# Patient Record
Sex: Male | Born: 2000
Health system: Southern US, Community
[De-identification: ages and names within clinical notes are randomized; demographics above are authoritative.]

## PROBLEM LIST (undated history)

## (undated) DIAGNOSIS — F909 Attention-deficit hyperactivity disorder, unspecified type: Secondary | ICD-10-CM

## (undated) HISTORY — DX: Attention-deficit hyperactivity disorder, unspecified type: F90.9

---
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
## 2018-02-07 ENCOUNTER — Encounter: Payer: Self-pay | Admitting: Sports Medicine

## 2018-02-07 ENCOUNTER — Ambulatory Visit: Payer: BLUE CROSS/BLUE SHIELD | Admitting: Sports Medicine

## 2018-02-07 ENCOUNTER — Ambulatory Visit (INDEPENDENT_AMBULATORY_CARE_PROVIDER_SITE_OTHER): Payer: BLUE CROSS/BLUE SHIELD

## 2018-02-07 ENCOUNTER — Ambulatory Visit: Payer: No Typology Code available for payment source | Admitting: Family Medicine

## 2018-02-07 DIAGNOSIS — M51369 Other intervertebral disc degeneration, lumbar region without mention of lumbar back pain or lower extremity pain: Secondary | ICD-10-CM

## 2018-02-07 DIAGNOSIS — M5136 Other intervertebral disc degeneration, lumbar region: Secondary | ICD-10-CM

## 2018-02-07 DIAGNOSIS — M5117 Intervertebral disc disorders with radiculopathy, lumbosacral region: Secondary | ICD-10-CM | POA: Diagnosis not present

## 2018-02-07 DIAGNOSIS — M5116 Intervertebral disc disorders with radiculopathy, lumbar region: Secondary | ICD-10-CM | POA: Diagnosis not present

## 2018-02-07 MED ORDER — GADOBENATE DIMEGLUMINE 529 MG/ML IV SOLN
20.0000 mL | Freq: Once | INTRAVENOUS | Status: AC | PRN
  Administered 2018-02-07: 20 mL via INTRAVENOUS

## 2018-02-07 NOTE — Progress Notes (Addendum)
Subjective:    CC: Back pain, leg pain  HPI: David Richards is a pleasant 17 year old male, in December of last year he had a L5-S1 laminectomy with discectomy for left-sided S1 radiculitis, unfortunately he had no relief in his back pain or his leg pain.  He did have some L4-L5 spinal stenosis on a previous MRI.  Symptoms are moderate, persistent, no bowel or bladder dysfunction, saddle numbness, some constitutional symptoms, trauma.  He runs down the back of his left leg to the back of the thigh but stops at the knee.  Worse with standing.  I reviewed the past medical history, family history, social history, surgical history, and allergies today and no changes were needed.  Please see the problem list section below in epic for further details.  Past Medical History: Past Medical History:  Diagnosis Date  . ADHD (attention deficit hyperactivity disorder)   . Asthma    exercise induced   Past Surgical History: No past surgical history on file. Social History: Social History   Socioeconomic History  . Marital status: Single    Spouse name: Not on file  . Number of children: Not on file  . Years of education: Not on file  . Highest education level: Not on file  Occupational History  . Occupation: Consulting civil engineer  Social Needs  . Financial resource strain: Not on file  . Food insecurity:    Worry: Not on file    Inability: Not on file  . Transportation needs:    Medical: Not on file    Non-medical: Not on file  Tobacco Use  . Smoking status: Never Smoker  . Smokeless tobacco: Never Used  Substance and Sexual Activity  . Alcohol use: No  . Drug use: No  . Sexual activity: Not on file  Lifestyle  . Physical activity:    Days per week: Not on file    Minutes per session: Not on file  . Stress: Not on file  Relationships  . Social connections:    Talks on phone: Not on file    Gets together: Not on file    Attends religious service: Not on file    Active member of club or  organization: Not on file    Attends meetings of clubs or organizations: Not on file    Relationship status: Not on file  Other Topics Concern  . Not on file  Social History Narrative   Not the best diet.  Plays basket ball for fun. In school at [employer].  Does well in school    Family History: Family History  Problem Relation Age of Onset  . Hypertension Father   . Obesity Father    Allergies: Allergies  Allergen Reactions  . Amoxicillin-Pot Clavulanate Other (See Comments)    Abdominal pain and cramping.   . Ampicillin Diarrhea   Medications: See med rec.  Review of Systems: No fevers, chills, night sweats, weight loss, chest pain, or shortness of breath.   Objective:    General: Well Developed, well nourished, and in no acute distress.  Neuro: Alert and oriented x3, extra-ocular muscles intact, sensation grossly intact.  HEENT: Normocephalic, atraumatic, pupils equal round reactive to light, neck supple, no masses, no lymphadenopathy, thyroid nonpalpable.  Skin: Warm and dry, no rashes. Cardiac: Regular rate and rhythm, no murmurs rubs or gallops, no lower extremity edema.  Respiratory: Clear to auscultation bilaterally. Not using accessory muscles, speaking in full sentences. Back Exam:  Inspection: Unremarkable  Motion: Flexion 45 deg, Extension  45 deg, Side Bending to 45 deg bilaterally,  Rotation to 45 deg bilaterally  SLR laying: Negative  XSLR laying: Negative  Palpable tenderness: None. FABER: negative. Sensory change: Gross sensation intact to all lumbar and sacral dermatomes.  Reflexes: 2+ at both patellar tendons, 2+ at achilles tendons, Babinski's downgoing.  Strength at foot  Plantar-flexion: 5/5 Dorsi-flexion: 5/5 Eversion: 5/5 Inversion: 5/5  Leg strength  Quad: 5/5 Hamstring: 5/5 Hip flexor: 5/5 Hip abductors: 5/5  Gait unremarkable.  Impression and Recommendations:    Lumbar degenerative disc disease Post L5-S1 laminectomy with discectomy for  left S1 radiculopathy. Never had resolution of radicular symptoms after his discectomy. No nerve conduction study done. At this point we are going to proceed with a new MRI, he did have some L4-L5 central canal stenosis. We will probably proceed with gabapentin first and then epidurals if not significant we better, I do for see a nerve conduction/EMG study in his future.  Unfortunately there is a large amount of residual disc herniation at the L5-S1 level clearly impinging the nerve roots, ultimately I think we need to try a couple of epidurals and if insufficient relief get a second opinion from a different spine surgeon, Dr. Estill BambergMark Dumonski. ___________________________________________ David Richards J. Benjamin Stainhekkekandam, M.D., ABFM., CAQSM. Primary Care and Sports Medicine [facility]  Adjunct Instructor of Family Medicine  [employer]

## 2018-02-07 NOTE — Assessment & Plan Note (Addendum)
Post L5-S1 laminectomy with discectomy for left S1 radiculopathy. Never had resolution of radicular symptoms after his discectomy. No nerve conduction study done. At this point we are going to proceed with a new MRI, he did have some L4-L5 central canal stenosis. We will probably proceed with gabapentin first and then epidurals if not significant we better, I do for see a nerve conduction/EMG study in his future.  Unfortunately there is a large amount of residual disc herniation at the L5-S1 level clearly impinging the nerve roots, ultimately I think we need to try a couple of epidurals and if insufficient relief get a second opinion from a different spine surgeon, Dr. Estill BambergMark Dumonski.

## 2018-02-08 ENCOUNTER — Ambulatory Visit: Payer: BLUE CROSS/BLUE SHIELD | Admitting: Sports Medicine

## 2018-02-08 ENCOUNTER — Encounter: Payer: Self-pay | Admitting: Sports Medicine

## 2018-02-08 DIAGNOSIS — M5136 Other intervertebral disc degeneration, lumbar region: Secondary | ICD-10-CM | POA: Diagnosis not present

## 2018-02-08 DIAGNOSIS — M51369 Other intervertebral disc degeneration, lumbar region without mention of lumbar back pain or lower extremity pain: Secondary | ICD-10-CM

## 2018-02-08 MED ORDER — DIAZEPAM 5 MG PO TABS: ORAL_TABLET | ORAL | 0 refills | Status: DC

## 2018-02-08 MED ORDER — PREGABALIN 75 MG PO CAPS: 75.0000 mg | ORAL_CAPSULE | Freq: Two times a day (BID) | ORAL | 0 refills | Status: DC

## 2018-02-08 NOTE — Assessment & Plan Note (Signed)
Post L5-S1 laminectomy with [facility]. Never had resolution of radicular symptoms after discectomy. No nerve conduction studies. He did have some L4-L5 central canal stenosis and a previous MRI, new MRI with and without contrast does show either recurrent or residual disc extrusion at L5-S1 with clear contact of the intraspinal left S1 nerve root. Proceeding with a left S1 epidural with Valium for preprocedural anxiolysis. I would also like him to get a second opinion from Dr. Estill BambergMark Dumonski and Jason CoopKayla McKenzie, PA-C. Adding some Lyrica in the meantime for symptom medic relief.

## 2018-02-08 NOTE — Progress Notes (Signed)
Subjective:    CC: MRI results  HPI: This is a pleasant 17 year old male with lumbar degenerative disc disease, 2 level.  He is post L5-S1 laminectomy with microdiscectomy, unfortunately never had resolution of his radicular pain after the surgery.  He presents to me today for a second opinion, we obtained an MRI with and without contrast that showed a large L5-S1 disc extrusion with contact to the left S1 nerve root.  He also has a moderate-sized L4-L5 disc protrusion.  Persistent pain down the left leg without weakness, bowel or bladder dysfunction, saddle numbness or constitutional symptoms.  I reviewed the past medical history, family history, social history, surgical history, and allergies today and no changes were needed.  Please see the problem list section below in epic for further details.  Past Medical History: Past Medical History:  Diagnosis Date  . ADHD (attention deficit hyperactivity disorder)   . Asthma    exercise induced   Past Surgical History: No past surgical history on file. Social History: Social History   Socioeconomic History  . Marital status: Single    Spouse name: Not on file  . Number of children: Not on file  . Years of education: Not on file  . Highest education level: Not on file  Occupational History  . Occupation: Consulting civil engineer  Social Needs  . Financial resource strain: Not on file  . Food insecurity:    Worry: Not on file    Inability: Not on file  . Transportation needs:    Medical: Not on file    Non-medical: Not on file  Tobacco Use  . Smoking status: Never Smoker  . Smokeless tobacco: Never Used  Substance and Sexual Activity  . Alcohol use: No  . Drug use: No  . Sexual activity: Not on file  Lifestyle  . Physical activity:    Days per week: Not on file    Minutes per session: Not on file  . Stress: Not on file  Relationships  . Social connections:    Talks on phone: Not on file    Gets together: Not on file    Attends religious  service: Not on file    Active member of club or organization: Not on file    Attends meetings of clubs or organizations: Not on file    Relationship status: Not on file  Other Topics Concern  . Not on file  Social History Narrative   Not the best diet.  Plays basket ball for fun. In school at [employer].  Does well in school    Family History: Family History  Problem Relation Age of Onset  . Hypertension Father   . Obesity Father    Allergies: Allergies  Allergen Reactions  . Amoxicillin-Pot Clavulanate Other (See Comments)    Abdominal pain and cramping.   . Ampicillin Diarrhea   Medications: See med rec.  Review of Systems: No fevers, chills, night sweats, weight loss, chest pain, or shortness of breath.   Objective:    General: Well Developed, well nourished, and in no acute distress.  Neuro: Alert and oriented x3, extra-ocular muscles intact, sensation grossly intact.  HEENT: Normocephalic, atraumatic, pupils equal round reactive to light, neck supple, no masses, no lymphadenopathy, thyroid nonpalpable.  Skin: Warm and dry, no rashes. Cardiac: Regular rate and rhythm, no murmurs rubs or gallops, no lower extremity edema.  Respiratory: Clear to auscultation bilaterally. Not using accessory muscles, speaking in full sentences.  Impression and Recommendations:    Lumbar degenerative  disc disease Post L5-S1 laminectomy with [facility]. Never had resolution of radicular symptoms after discectomy. No nerve conduction studies. He did have some L4-L5 central canal stenosis and a previous MRI, new MRI with and without contrast does show either recurrent or residual disc extrusion at L5-S1 with clear contact of the intraspinal left S1 nerve root. Proceeding with a left S1 epidural with Valium for preprocedural anxiolysis. I would also like him to get a second opinion from Dr. Estill BambergMark Dumonski and Jason CoopKayla McKenzie, PA-C. Adding some Lyrica in  the meantime for symptom medic relief.  I spent 40 minutes with this patient, greater than 50% was face-to-face time counseling regarding the above diagnoses, this was spent discussing the natural history of degenerative disc disease. ___________________________________________ Ihor Austinhomas J. Benjamin Stainhekkekandam, M.D., ABFM., CAQSM. Primary Care and Sports Medicine [facility]  Adjunct Instructor of Family Medicine  [employer]

## 2018-02-11 ENCOUNTER — Ambulatory Visit: Payer: Self-pay | Admitting: Sports Medicine

## 2018-02-22 ENCOUNTER — Encounter: Payer: Self-pay | Admitting: Sports Medicine

## 2018-02-23 ENCOUNTER — Other Ambulatory Visit: Payer: BLUE CROSS/BLUE SHIELD

## 2018-02-23 MED ORDER — GABAPENTIN 300 MG PO CAPS: ORAL_CAPSULE | ORAL | 3 refills | Status: DC

## 2018-02-24 ENCOUNTER — Ambulatory Visit
Admission: RE | Admit: 2018-02-24 | Discharge: 2018-02-24 | Disposition: A | Discharge status: Home / Self Care | Payer: BLUE CROSS/BLUE SHIELD | Source: Ambulatory Visit | Attending: Sports Medicine | Admitting: Sports Medicine

## 2018-02-24 MED ORDER — METHYLPREDNISOLONE ACETATE 40 MG/ML INJ SUSP (RADIOLOG
120.0000 mg | Freq: Once | INTRAMUSCULAR | Status: AC
  Administered 2018-02-24: 120 mg via EPIDURAL

## 2018-02-24 MED ORDER — IOPAMIDOL (ISOVUE-M 200) INJECTION 41%
1.0000 mL | Freq: Once | INTRAMUSCULAR | Status: AC
  Administered 2018-02-24: 1 mL via EPIDURAL

## 2018-02-24 NOTE — Discharge Instructions (Signed)

## 2018-02-25 ENCOUNTER — Encounter: Payer: Self-pay | Admitting: Sports Medicine

## 2018-03-15 ENCOUNTER — Encounter: Payer: Self-pay | Admitting: Sports Medicine

## 2018-03-15 DIAGNOSIS — M5136 Other intervertebral disc degeneration, lumbar region: Secondary | ICD-10-CM

## 2018-03-15 DIAGNOSIS — M51369 Other intervertebral disc degeneration, lumbar region without mention of lumbar back pain or lower extremity pain: Secondary | ICD-10-CM

## 2018-03-15 MED ORDER — PREGABALIN 75 MG PO CAPS: 75.0000 mg | ORAL_CAPSULE | Freq: Two times a day (BID) | ORAL | 0 refills | Status: DC

## 2018-06-27 ENCOUNTER — Ambulatory Visit (INDEPENDENT_AMBULATORY_CARE_PROVIDER_SITE_OTHER): Payer: BLUE CROSS/BLUE SHIELD | Admitting: Sports Medicine

## 2018-06-27 DIAGNOSIS — Q667 Congenital pes cavus, unspecified foot: Secondary | ICD-10-CM

## 2018-06-27 NOTE — Progress Notes (Signed)
    Patient was fitted for a : standard, cushioned, semi-rigid orthotic. The orthotic was heated and afterward the patient stood on the orthotic blank positioned on the orthotic stand. The patient was positioned in subtalar neutral position and 10 degrees of ankle dorsiflexion in a weight bearing stance. After completion of molding, a stable base was applied to the orthotic blank. The blank was ground to a stable position for weight bearing. Size: 14 Base: White Doctor, hospital and Padding: None The patient ambulated these, and they were very comfortable.  I spent 30 minutes with this patient regarding orthotic management and training, fitting, and ambulation.  ___________________________________________ [name]. Benjamin Stain, M.D., ABFM., CAQSM. Primary Care and Sports Medicine [facility]  Adjunct Instructor of Family Medicine  [employer]

## 2018-06-27 NOTE — Assessment & Plan Note (Signed)
New set of custom orthotics as above. 

## 2018-12-12 ENCOUNTER — Telehealth: Payer: Self-pay | Admitting: Family Medicine

## 2018-12-12 NOTE — Telephone Encounter (Signed)
Thank you, mom aware.

## 2018-12-12 NOTE — Telephone Encounter (Signed)
This will be fine as long as he doesn't have any problems that he needs to be seen for. In other words he's healthy. Maryruth Eve, Lahoma Crocker, CMA

## 2018-12-12 NOTE — Telephone Encounter (Signed)
Ms. David Richards called. Her son is due for a physical but he hasn't seen you in about 5 yrs. I explained to mom that we do not do physicals on first visit but she wanted to know if you would make an exception? Thanks

## 2018-12-16 ENCOUNTER — Ambulatory Visit (INDEPENDENT_AMBULATORY_CARE_PROVIDER_SITE_OTHER): Payer: BC Managed Care – PPO | Admitting: Family Medicine

## 2018-12-16 ENCOUNTER — Other Ambulatory Visit: Payer: Self-pay

## 2018-12-16 ENCOUNTER — Encounter: Payer: Self-pay | Admitting: Family Medicine

## 2018-12-16 VITALS — BP 125/69 | HR 73 | Ht 68.7 in | Wt 243.0 lb

## 2018-12-16 DIAGNOSIS — Z0000 Encounter for general adult medical examination without abnormal findings: Secondary | ICD-10-CM | POA: Diagnosis not present

## 2018-12-16 NOTE — Progress Notes (Signed)
Established Patient Office Visit  Subjective:  Patient ID: David Levi., male    DOB: 2000/07/27  Age: 18 y.o. MRN: [id number]  CC:  Chief Complaint  Patient presents with  . Annual Exam    HPI David Pat. presents for CPE and to re- estab care.   He is doing well overall.  He is currently working full-time is not in school.  He says even with COVID he still been working at [employer].  They make sitting in chairs for airplanes.  He says he tries to stay active and has been doing some walking but no consistent exercise routine.  That he has cut out soda more recently.  He does have some problems with his back and has a history of a herniated disc with some mild nerve compression but says it eventually just got better on its own.  He has been doing well since then he avoids doing any heavy lifting.  He would like to get some routine blood work done with his physical today.   Past Medical History:  Diagnosis Date  . ADHD (attention deficit hyperactivity disorder)   . Asthma    exercise induced    No past surgical history on file.  Family History  Problem Relation Age of Onset  . Hypertension Father   . Obesity Father     Social History   Socioeconomic History  . Marital status: Single    Spouse name: Not on file  . Number of children: Not on file  . Years of education: Not on file  . Highest education level: Not on file  Occupational History  . Occupation: Ship broker  Social Needs  . Financial resource strain: Not on file  . Food insecurity    Worry: Not on file    Inability: Not on file  . Transportation needs    Medical: Not on file    Non-medical: Not on file  Tobacco Use  . Smoking status: Never Smoker  . Smokeless tobacco: Never Used  Substance and Sexual Activity  . Alcohol use: No  . Drug use: No  . Sexual activity: Not on file  Lifestyle  . Physical activity    Days per week: Not on file    Minutes per session: Not on file  .  Stress: Not on file  Relationships  . Social Herbalist on phone: Not on file    Gets together: Not on file    Attends religious service: Not on file    Active member of club or organization: Not on file    Attends meetings of clubs or organizations: Not on file    Relationship status: Not on file  . Intimate partner violence    Fear of current or ex partner: Not on file    Emotionally abused: Not on file    Physically abused: Not on file    Forced sexual activity: Not on file  Other Topics Concern  . Not on file  Social History Narrative   Not the best diet.  Plays basket ball for fun. In school at [employer].  Does well in school     Outpatient Medications Prior to Visit  Medication Sig Dispense Refill  . diazepam (VALIUM) 5 MG tablet 1 tab 2 hours before procedure or imaging, take another tab 30 minutes to 1 hour before if not feeling relaxed 2 tablet 0  . fluticasone (FLONASE) 50 MCG/ACT nasal spray Place 1 spray into  the nose daily.      . pregabalin (LYRICA) 75 MG capsule Take 1 capsule (75 mg total) by mouth 2 (two) times daily. 180 capsule 0  . VENTOLIN HFA 108 (90 BASE) MCG/ACT inhaler INHALE 2 PUFFS INTO THE LUNGS EVERY 6 (SIX) HOURS AS NEEDED FOR WHEEZING. 1 Inhaler 4   No facility-administered medications prior to visit.     Allergies  Allergen Reactions  . Amoxicillin-Pot Clavulanate Other (See Comments)    Abdominal pain and cramping.   . Ampicillin Diarrhea    ROS Review of Systems    Objective:    Physical Exam  Constitutional: He is oriented to person, place, and time. He appears well-developed and well-nourished.  HENT:  Head: Normocephalic and atraumatic.  Right Ear: External ear normal.  Left Ear: External ear normal.  Nose: Nose normal.  Mouth/Throat: Oropharynx is clear and moist.  Eyes: Pupils are equal, round, and reactive to light. Conjunctivae and EOM are normal.  Neck: Normal range of motion. Neck supple. No thyromegaly present.   Cardiovascular: Normal rate, regular rhythm, normal heart sounds and intact distal pulses.  Pulmonary/Chest: Effort normal and breath sounds normal.  Abdominal: Soft. Bowel sounds are normal. He exhibits no distension and no mass. There is no abdominal tenderness. There is no rebound and no guarding.  Musculoskeletal: Normal range of motion.  Lymphadenopathy:    He has no cervical adenopathy.  Neurological: He is alert and oriented to person, place, and time. He has normal reflexes.  Skin: Skin is warm and dry.  Psychiatric: He has a normal mood and affect. His behavior is normal. Judgment and thought content normal.    BP 125/69   Pulse 73   Ht 5' 8.7" (1.745 m)   Wt 243 lb (110.2 kg)   SpO2 99%   BMI 36.20 kg/m  Wt Readings from Last 3 Encounters:  12/16/18 243 lb (110.2 kg)  02/07/18 226 lb (102.5 kg) (98 %, Z= 1.99)*  04/08/17 226 lb 4.8 oz (102.6 kg) (98 %, Z= 2.05)*   * Growth percentiles are based on CDC (Boys, 2-20 Years) data.     Health Maintenance Due  Topic Date Due  . HIV Screening  07/24/2013    There are no preventive care reminders to display for this patient.  Lab Results  Component Value Date   TSH 1.108 01/09/2014   Lab Results  Component Value Date   WBC 9.8 02/03/2011   HGB 14.3 02/03/2011   HCT 44.7 (H) 02/03/2011   MCV 86.3 02/03/2011   PLT 429 (H) 02/03/2011   Lab Results  Component Value Date   NA 139 07/16/2014   K 4.5 07/16/2014   CO2 26 07/16/2014   GLUCOSE 86 07/16/2014   BUN 12 07/16/2014   CREATININE 0.90 07/16/2014   BILITOT 0.5 07/16/2014   ALKPHOS 134 07/16/2014   AST 24 07/16/2014   ALT 40 07/16/2014   PROT 7.7 07/16/2014   ALBUMIN 5.0 07/16/2014   CALCIUM 10.2 07/16/2014   Lab Results  Component Value Date   CHOL 174 (H) 07/16/2014   Lab Results  Component Value Date   HDL 31 07/16/2014   Lab Results  Component Value Date   LDLCALC 121 (H) 07/16/2014   Lab Results  Component Value Date   TRIG 109  07/16/2014   Lab Results  Component Value Date   CHOLHDL 5.6 07/16/2014   Lab Results  Component Value Date   HGBA1C 5.3 07/16/2014      Assessment &  Plan:   Problem List Items Addressed This Visit    None    Visit Diagnoses    Wellness examination    -  Primary   Relevant Orders   COMPLETE METABOLIC PANEL WITH GFR   Lipid panel   CBC     Keep up a regular exercise program and make sure you are eating a healthy diet Try to eat 4 servings of dairy a day, or if you are lactose intolerant take a calcium with vitamin D daily.  Your vaccines are up to date.  Do not have a complete version of all of his childhood vaccines but he is up-to-date on his tetanus.  I gave him a copy of what we do not have.  Just encouraged him to continue to work on increased exercise and activity and healthy diet particularly for weight loss.   No orders of the defined types were placed in this encounter.   Follow-up: Return in about 1 year (around 12/16/2019).    Nani Gasseratherine Braden Deloach, MD

## 2018-12-16 NOTE — Patient Instructions (Signed)

## 2018-12-17 LAB — COMPLETE METABOLIC PANEL WITH GFR
AG Ratio: 2 (calc) (ref 1.0–2.5)
ALT: 71 U/L — ABNORMAL HIGH (ref 9–46)
AST: 33 U/L (ref 10–40)
Albumin: 4.9 g/dL (ref 3.6–5.1)
Alkaline phosphatase (APISO): 89 U/L (ref 36–130)
BUN: 14 mg/dL (ref 7–25)
CO2: 23 mmol/L (ref 20–32)
Calcium: 10.2 mg/dL (ref 8.6–10.3)
Chloride: 102 mmol/L (ref 98–110)
Creat: 0.91 mg/dL (ref 0.60–1.35)
GFR, Est African American: 140 mL/min/{1.73_m2} (ref >=60)
GFR, Est Non African American: 121 mL/min/{1.73_m2} (ref >=60)
Globulin: 2.4 g/dL (calc) (ref 1.9–3.7)
Glucose, Bld: 77 mg/dL (ref 65–99)
Potassium: 4.4 mmol/L (ref 3.5–5.3)
Sodium: 137 mmol/L (ref 135–146)
Total Bilirubin: 0.5 mg/dL (ref 0.2–1.2)
Total Protein: 7.3 g/dL (ref 6.1–8.1)

## 2018-12-17 LAB — CBC
HCT: 45.2 % (ref 38.5–50.0)
Hemoglobin: 15.1 g/dL (ref 13.2–17.1)
MCH: 28.6 pg (ref 27.0–33.0)
MCHC: 33.4 g/dL (ref 32.0–36.0)
MCV: 85.6 fL (ref 80.0–100.0)
MPV: 10.4 fL (ref 7.5–12.5)
Platelets: 414 10*3/uL — ABNORMAL HIGH (ref 140–400)
RBC: 5.28 10*6/uL (ref 4.20–5.80)
RDW: 13.1 % (ref 11.0–15.0)
WBC: 11.5 10*3/uL — ABNORMAL HIGH (ref 3.8–10.8)

## 2018-12-17 LAB — LIPID PANEL
Cholesterol: 211 mg/dL — ABNORMAL HIGH (ref <200)
HDL: 35 mg/dL — ABNORMAL LOW (ref >=40)
LDL Cholesterol (Calc): 158 mg/dL (calc) — ABNORMAL HIGH
Non-HDL Cholesterol (Calc): 176 mg/dL (calc) — ABNORMAL HIGH (ref <130)
Total CHOL/HDL Ratio: 6 (calc) — ABNORMAL HIGH (ref <5.0)
Triglycerides: 81 mg/dL (ref <150)

## 2019-01-09 ENCOUNTER — Other Ambulatory Visit: Payer: Self-pay

## 2019-01-09 ENCOUNTER — Ambulatory Visit (INDEPENDENT_AMBULATORY_CARE_PROVIDER_SITE_OTHER): Payer: BC Managed Care – PPO

## 2019-01-09 ENCOUNTER — Encounter: Payer: Self-pay | Admitting: Family Medicine

## 2019-01-09 ENCOUNTER — Telehealth (INDEPENDENT_AMBULATORY_CARE_PROVIDER_SITE_OTHER): Payer: BC Managed Care – PPO | Admitting: Family Medicine

## 2019-01-09 VITALS — BP 124/78 | HR 76 | Ht 69.0 in | Wt 248.0 lb

## 2019-01-09 DIAGNOSIS — B353 Tinea pedis: Secondary | ICD-10-CM | POA: Diagnosis not present

## 2019-01-09 DIAGNOSIS — R748 Abnormal levels of other serum enzymes: Secondary | ICD-10-CM | POA: Diagnosis not present

## 2019-01-09 DIAGNOSIS — Z720 Tobacco use: Secondary | ICD-10-CM

## 2019-01-09 DIAGNOSIS — R7989 Other specified abnormal findings of blood chemistry: Secondary | ICD-10-CM

## 2019-01-09 MED ORDER — CLOTRIMAZOLE-BETAMETHASONE 1-0.05 % EX CREA: 1.0000 "application " | TOPICAL_CREAM | Freq: Two times a day (BID) | CUTANEOUS | 0 refills | Status: DC

## 2019-01-09 NOTE — Assessment & Plan Note (Signed)
ALT liver enzyme was elevated in the 70s similar to about 7 years ago when he had a mild bump and then was able to get it back down to normal range.  We discussed the importance of diet and exercise and weight loss to get it under control.  Recommend that he work on this over the next 3 to 4 weeks and then go to the lab to recheck the level to make sure that at least it is trending downward.

## 2019-01-09 NOTE — Assessment & Plan Note (Signed)
Reviewed labs with him today which indicate that he has a chronically elevated platelet count.  I think this is probably his baseline but we will keep an eye on it over the next couple of years.

## 2019-01-09 NOTE — Progress Notes (Signed)
Pt is requesting a CXR because he has been using a vape. Maryruth Eve, Lahoma Crocker, CMA

## 2019-01-09 NOTE — Progress Notes (Addendum)
Established Patient Office Visit  Subjective:  Patient ID: David GessGregory D Bealer Jr., male    DOB: 10/26/2000  Age: 18 y.o. MRN: [id number]  CC:  Chief Complaint  Patient presents with  . Follow-up    results of labs    HPI David GessGregory D Dwight Jr. presents for f/u labs.  He would like to go over the result as he has had some abnormals  He has c/o or a rash on his left foot. Says it comes and goes. It is very itchy,  He wears boots daily and does sweat a lot. Has been using Tea Tree oil and it helps some but keeps coming back.    Past Medical History:  Diagnosis Date  . ADHD (attention deficit hyperactivity disorder)   . Asthma    exercise induced    History reviewed. No pertinent surgical history.  Family History  Problem Relation Age of Onset  . Hypertension Father   . Obesity Father     Social History   Socioeconomic History  . Marital status: Single    Spouse name: Not on file  . Number of children: Not on file  . Years of education: Not on file  . Highest education level: Not on file  Occupational History  . Occupation: Consulting civil engineertudent  Social Needs  . Financial resource strain: Not on file  . Food insecurity    Worry: Not on file    Inability: Not on file  . Transportation needs    Medical: Not on file    Non-medical: Not on file  Tobacco Use  . Smoking status: Never Smoker  . Smokeless tobacco: Never Used  Substance and Sexual Activity  . Alcohol use: No  . Drug use: No  . Sexual activity: Not on file  Lifestyle  . Physical activity    Days per week: Not on file    Minutes per session: Not on file  . Stress: Not on file  Relationships  . Social Musicianconnections    Talks on phone: Not on file    Gets together: Not on file    Attends religious service: Not on file    Active member of club or organization: Not on file    Attends meetings of clubs or organizations: Not on file    Relationship status: Not on file  . Intimate partner violence    Fear of current or ex  partner: Not on file    Emotionally abused: Not on file    Physically abused: Not on file    Forced sexual activity: Not on file  Other Topics Concern  . Not on file  Social History Narrative   Not the best diet.     No outpatient medications prior to visit.   No facility-administered medications prior to visit.     Allergies  Allergen Reactions  . Amoxicillin-Pot Clavulanate Other (See Comments)    Abdominal pain and cramping.   . Ampicillin Diarrhea    ROS Review of Systems    Objective:    Physical Exam  Constitutional: He is oriented to person, place, and time. He appears well-developed and well-nourished.  HENT:  Head: Normocephalic and atraumatic.  Eyes: Conjunctivae and EOM are normal.  Cardiovascular: Normal rate.  Pulmonary/Chest: Effort normal.  Neurological: He is alert and oriented to person, place, and time.  Skin: Skin is dry. Rash noted. No pallor.  On his left foot he has some erythema in the webspace between the great and 2nd toe. He has  some scabbing and erythema on the top of the foot near the toes and around the great toe.   Psychiatric: He has a normal mood and affect. His behavior is normal.  Vitals reviewed.   BP 124/78   Pulse 76   Ht 5\' 9"  (1.753 m)   Wt 248 lb (112.5 kg)   SpO2 96%   BMI 36.62 kg/m  Wt Readings from Last 3 Encounters:  01/09/19 248 lb (112.5 kg)  12/16/18 243 lb (110.2 kg)  02/07/18 226 lb (102.5 kg) (98 %, Z= 1.99)*   * Growth percentiles are based on CDC (Boys, 2-20 Years) data.     Health Maintenance Due  Topic Date Due  . HIV Screening  07/24/2013  . INFLUENZA VACCINE  12/24/2018    There are no preventive care reminders to display for this patient.  Lab Results  Component Value Date   TSH 1.108 01/09/2014   Lab Results  Component Value Date   WBC 11.5 (H) 12/16/2018   HGB 15.1 12/16/2018   HCT 45.2 12/16/2018   MCV 85.6 12/16/2018   PLT 414 (H) 12/16/2018   Lab Results  Component Value Date    NA 137 12/16/2018   K 4.4 12/16/2018   CO2 23 12/16/2018   GLUCOSE 77 12/16/2018   BUN 14 12/16/2018   CREATININE 0.91 12/16/2018   BILITOT 0.5 12/16/2018   ALKPHOS 134 07/16/2014   AST 33 12/16/2018   ALT 71 (H) 12/16/2018   PROT 7.3 12/16/2018   ALBUMIN 5.0 07/16/2014   CALCIUM 10.2 12/16/2018   Lab Results  Component Value Date   CHOL 211 (H) 12/16/2018   Lab Results  Component Value Date   HDL 35 (L) 12/16/2018   Lab Results  Component Value Date   LDLCALC 158 (H) 12/16/2018   Lab Results  Component Value Date   TRIG 81 12/16/2018   Lab Results  Component Value Date   CHOLHDL 6.0 (H) 12/16/2018   Lab Results  Component Value Date   HGBA1C 5.3 07/16/2014      Assessment & Plan:   Problem List Items Addressed This Visit      Other   Tobacco use    He has been vaping for 5 years and per his parents want him to get a chest x-ray.  He is asymptomatic he has not had any cough or shortness of breath.  But they would like for him to have 1 I discussed that is not typical routine for screening just so that he understands and that it will probably be normal but strongly encouraged him to discontinue vaping.      Relevant Orders   DG Chest 2 View (Completed)   Elevated platelet count    Reviewed labs with him today which indicate that he has a chronically elevated platelet count.  I think this is probably his baseline but we will keep an eye on it over the next couple of years.      Elevated liver enzymes - Primary    ALT liver enzyme was elevated in the 70s similar to about 7 years ago when he had a mild bump and then was able to get it back down to normal range.  We discussed the importance of diet and exercise and weight loss to get it under control.  Recommend that he work on this over the next 3 to 4 weeks and then go to the lab to recheck the level to make sure that at least it  is trending downward.      Relevant Orders   Hepatic function panel    Other  Visit Diagnoses    Tinea pedis of left foot       Relevant Medications   clotrimazole-betamethasone (LOTRISONE) cream     Tinea pedis/foot rash - will tx swith Lotrisone cream x 2 weeks. Call if not better in 2 weeks.    Meds ordered this encounter  Medications  . clotrimazole-betamethasone (LOTRISONE) cream    Sig: Apply 1 application topically 2 (two) times daily.    Dispense:  45 g    Refill:  0    Follow-up: Return in about 1 year (around 01/09/2020) for wellness exam .    Beatrice Lecher, MD

## 2019-01-09 NOTE — Assessment & Plan Note (Signed)
He has been vaping for 5 years and per his parents want him to get a chest x-ray.  He is asymptomatic he has not had any cough or shortness of breath.  But they would like for him to have 1 I discussed that is not typical routine for screening just so that he understands and that it will probably be normal but strongly encouraged him to discontinue vaping.

## 2019-01-09 NOTE — Addendum Note (Signed)
Addended by: Beatrice Lecher D on: 01/09/2019 07:58 PM   Modules accepted: Orders

## 2019-08-16 ENCOUNTER — Ambulatory Visit (INDEPENDENT_AMBULATORY_CARE_PROVIDER_SITE_OTHER): Payer: BC Managed Care – PPO

## 2019-08-16 ENCOUNTER — Other Ambulatory Visit: Payer: Self-pay

## 2019-08-16 ENCOUNTER — Encounter: Payer: Self-pay | Admitting: Sports Medicine

## 2019-08-16 ENCOUNTER — Ambulatory Visit (INDEPENDENT_AMBULATORY_CARE_PROVIDER_SITE_OTHER): Payer: BC Managed Care – PPO | Admitting: Sports Medicine

## 2019-08-16 DIAGNOSIS — M222X1 Patellofemoral disorders, right knee: Secondary | ICD-10-CM | POA: Insufficient documentation

## 2019-08-16 DIAGNOSIS — M25862 Other specified joint disorders, left knee: Secondary | ICD-10-CM

## 2019-08-16 MED ORDER — MELOXICAM 15 MG PO TABS: ORAL_TABLET | ORAL | 3 refills | Status: DC

## 2019-08-16 NOTE — Progress Notes (Signed)
    Procedures performed today:    None.  Independent interpretation of notes and tests performed by another provider:   None.  Impression and Recommendations:    Patellofemoral dysfunction, left Mathew has had several months of pain under his kneecap, medial patellar facet. This is consistent with patellofemoral pain syndrome, we will start conservatively with meloxicam, x-rays, formal physical therapy. I did tell him to make an appointment with Dr. Linford Arnold, he does need to lose a lot of weight which is certainly contributing. Return to see me in 4 to 6 weeks.    ___________________________________________ [name]. Benjamin Stain, M.D., ABFM., CAQSM. Primary Care and Sports Medicine [facility]  Adjunct Instructor of Family Medicine  [employer]

## 2019-08-16 NOTE — Assessment & Plan Note (Addendum)
David Richards has had several months of pain under his kneecap, medial patellar facet. This is consistent with patellofemoral pain syndrome, we will start conservatively with meloxicam, x-rays, formal physical therapy. I did tell him to make an appointment with Dr. Linford Arnold, he does need to lose a lot of weight which is certainly contributing. Return to see me in 4 to 6 weeks.

## 2019-08-21 ENCOUNTER — Encounter: Payer: Self-pay | Admitting: Family Medicine

## 2019-08-21 ENCOUNTER — Ambulatory Visit (INDEPENDENT_AMBULATORY_CARE_PROVIDER_SITE_OTHER): Payer: BC Managed Care – PPO | Admitting: Family Medicine

## 2019-08-21 ENCOUNTER — Other Ambulatory Visit: Payer: Self-pay

## 2019-08-21 VITALS — BP 132/81 | HR 63 | Temp 97.6°F | Ht 68.9 in | Wt 249.1 lb

## 2019-08-21 DIAGNOSIS — Z6836 Body mass index (BMI) 36.0-36.9, adult: Secondary | ICD-10-CM

## 2019-08-21 DIAGNOSIS — R748 Abnormal levels of other serum enzymes: Secondary | ICD-10-CM

## 2019-08-21 DIAGNOSIS — R635 Abnormal weight gain: Secondary | ICD-10-CM | POA: Diagnosis not present

## 2019-08-21 LAB — COMPLETE METABOLIC PANEL WITH GFR
AG Ratio: 2.1 (calc) (ref 1.0–2.5)
ALT: 66 U/L — ABNORMAL HIGH (ref 9–46)
AST: 32 U/L (ref 10–40)
Albumin: 5 g/dL (ref 3.6–5.1)
Alkaline phosphatase (APISO): 90 U/L (ref 36–130)
BUN: 14 mg/dL (ref 7–25)
CO2: 29 mmol/L (ref 20–32)
Calcium: 10.2 mg/dL (ref 8.6–10.3)
Chloride: 104 mmol/L (ref 98–110)
Creat: 0.86 mg/dL (ref 0.60–1.35)
GFR, Est African American: 144 mL/min/{1.73_m2} (ref >=60)
GFR, Est Non African American: 124 mL/min/{1.73_m2} (ref >=60)
Globulin: 2.4 g/dL (calc) (ref 1.9–3.7)
Glucose, Bld: 87 mg/dL (ref 65–99)
Potassium: 4.8 mmol/L (ref 3.5–5.3)
Sodium: 139 mmol/L (ref 135–146)
Total Bilirubin: 0.5 mg/dL (ref 0.2–1.2)
Total Protein: 7.4 g/dL (ref 6.1–8.1)

## 2019-08-21 NOTE — Assessment & Plan Note (Addendum)
BMI 36.  We discussed some meal and snack options that he could consider during the day.  Also really discussed increasing vegetable intake which is very minimal currently.  He feels like he is going to really struggle to exercise on the days that he works so we discussed starting with 2 days a week on his days off.  I encouraged him to have a goal of 30 minutes of aerobic activity on these 2 days.  He could certainly break those up into smaller increments for a total of 30 minutes.  He feels like he already does a good job in drinking water daily he does not really drink a lot of sweetened beverages.  We also discussed downloading one of the smart phone app such as my fitness pal or lose it to help him set goals.  Current Weight: 249 lbs. Exercise goal: Exercise 2 days a week on your days off.  Goal for 30 minutes of aerobic activity.  You can split this up into 215-minute segments as you build up your endurance. Dietary goal: Start to use a fitness app such as my fitness pal for lose it to set a calorie goal for each day and to log your nutrition. Medication: None Follow-up: 4 weeks: Goal Weight: 220 lbs.

## 2019-08-21 NOTE — Progress Notes (Signed)
Established Patient Office Visit  Subjective:  Patient ID: David Band., male    DOB: [birth year]  Age: 19 y.o. MRN: [id number]  CC: No chief complaint on file.   HPI David Richards. presents to discuss strategies for weight loss.  He is currently a 19 year old male who weighs 249 pounds his BMI is 36.  He has a history of exercise-induced asthma as well as low back and knee pain problems.  He also has history of mild elevation in liver enzymes.  Last time they were checked was in July and his ALT was 71.  AST was normal.  He would really like to lose about 20 to 30 pounds ideally.  His dad is obese and says his dad gets on him a bit.  Does like he struggles the most with just working long hours.  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  He does take his lunch most days.  He typically will eat a frozen sausage biscuit when he puts in the microwave and had [place].  He will often have a sandwich for lunch.  He does not really eat a lot of vegetables.  Past Medical History:  Diagnosis Date  . ADHD (attention deficit hyperactivity disorder)   . Asthma    exercise induced    History reviewed. No pertinent surgical history.  Family History  Problem Relation Age of Onset  . Hypertension Father   . Obesity Father     Social History   Socioeconomic History  . Marital status: Single    Spouse name: Not on file  . Number of children: Not on file  . Years of education: Not on file  . Highest education level: Not on file  Occupational History  . Occupation: Consulting civil engineer  Tobacco Use  . Smoking status: Never Smoker  . Smokeless tobacco: Never Used  Substance and Sexual Activity  . Alcohol use: No  . Drug use: No  . Sexual activity: Not on file  Other Topics Concern  . Not on file  Social History Narrative   Not the best diet.    Social Determinants of Health   Financial Resource Strain:    . Difficulty of Paying Living Expenses:   Food Insecurity:   . Worried About Programme researcher, broadcasting/film/video in the Last Year:   . Barista in the Last Year:   Transportation Needs:   . Freight forwarder (Medical):   Marland Kitchen Lack of Transportation (Non-Medical):   Physical Activity:   . Days of Exercise per Week:   . Minutes of Exercise per Session:   Stress:   . Feeling of Stress :   Social Connections:   . Frequency of Communication with Friends and Family:   . Frequency of Social Gatherings with Friends and Family:   . Attends Religious Services:   . Active Member of Clubs or Organizations:   . Attends Banker Meetings:   Marland Kitchen Marital Status:   Intimate Partner Violence:   . Fear of Current or Ex-Partner:   . Emotionally Abused:   Marland Kitchen Physically Abused:   . Sexually Abused:     Outpatient Medications Prior to Visit  Medication Sig Dispense Refill  . clindamycin (CLEOCIN) 300 MG capsule Take 300 mg by mouth every 8 (eight) hours.    . clotrimazole-betamethasone (LOTRISONE) cream Apply 1 application topically 2 (two) times daily.  45 g 0  . meloxicam (MOBIC) 15 MG tablet One tab PO qAM with a meal for 2 weeks, then daily prn pain. 30 tablet 3  . SODIUM FLUORIDE 5000 SENSITIVE 1.1-5 % PSTE USE TO BRUSH TEETH TWICE A DAY *DO NOT RINSE, EAT DRINK AFER BRUSHING FOR 30 MINUTES*     No facility-administered medications prior to visit.    Allergies  Allergen Reactions  . Amoxicillin-Pot Clavulanate Other (See Comments)    Abdominal pain and cramping.   . Ampicillin Diarrhea    ROS Review of Systems    Objective:    Physical Exam  Constitutional: He is oriented to person, place, and time. He appears well-developed and well-nourished.  HENT:  Head: Normocephalic and atraumatic.  Cardiovascular: Normal rate, regular rhythm and normal heart sounds.  Pulmonary/Chest: Effort normal and breath sounds normal.  Neurological: He is alert and oriented to person, place, and  time.  Skin: Skin is warm and dry.  Psychiatric: He has a normal mood and affect. His behavior is normal.    BP 132/81 (BP Location: Right Arm, Patient Position: Sitting, Cuff Size: Large)   Pulse 63   Temp 97.6 F (36.4 C) (Oral)   Ht 5' 8.9" (1.75 m)   Wt 249 lb 1.9 oz (113 kg)   SpO2 99%   BMI 36.90 kg/m  Wt Readings from Last 3 Encounters:  08/21/19 249 lb 1.9 oz (113 kg)  01/09/19 248 lb (112.5 kg)  12/16/18 243 lb (110.2 kg)     Health Maintenance Due  Topic Date Due  . HIV Screening  Never done    There are no preventive care reminders to display for this patient.  Lab Results  Component Value Date   TSH 1.108 01/09/2014   Lab Results  Component Value Date   WBC 11.5 (H) 12/16/2018   HGB 15.1 12/16/2018   HCT 45.2 12/16/2018   MCV 85.6 12/16/2018   PLT 414 (H) 12/16/2018   Lab Results  Component Value Date   NA 137 12/16/2018   K 4.4 12/16/2018   CO2 23 12/16/2018   GLUCOSE 77 12/16/2018   BUN 14 12/16/2018   CREATININE 0.91 12/16/2018   BILITOT 0.5 12/16/2018   ALKPHOS 134 07/16/2014   AST 33 12/16/2018   ALT 71 (H) 12/16/2018   PROT 7.3 12/16/2018   ALBUMIN 5.0 07/16/2014   CALCIUM 10.2 12/16/2018   Lab Results  Component Value Date   CHOL 211 (H) 12/16/2018   Lab Results  Component Value Date   HDL 35 (L) 12/16/2018   Lab Results  Component Value Date   LDLCALC 158 (H) 12/16/2018   Lab Results  Component Value Date   TRIG 81 12/16/2018   Lab Results  Component Value Date   CHOLHDL 6.0 (H) 12/16/2018   Lab Results  Component Value Date   HGBA1C 5.3 07/16/2014      Assessment & Plan:   Problem List Items Addressed This Visit      Other   Elevated liver enzymes    Due to recheck enzymes.  I am excited that he is ready to work on losing weight this will make a big impact and there is elevated liver enzymes as well as his cholesterol levels.  We can also plan to recheck those again in 3 to 6 months to see if they are  improving as he loses weight.      Relevant Orders   COMPLETE METABOLIC PANEL WITH GFR   BMI 74.1-28.7,OMVEH -  Primary    BMI 36.  We discussed some meal and snack options that he could consider during the day.  Also really discussed increasing vegetable intake which is very minimal currently.  He feels like he is going to really struggle to exercise on the days that he works so we discussed starting with 2 days a week on his days off.  I encouraged him to have a goal of 30 minutes of aerobic activity on these 2 days.  He could certainly break those up into smaller increments for a total of 30 minutes.  He feels like he already does a good job in drinking water daily he does not really drink a lot of sweetened beverages.  We also discussed downloading one of the smart phone app such as my fitness pal or lose it to help him set goals.  Current Weight: 249 lbs. Exercise goal: Exercise 2 days a week on your days off.  Goal for 30 minutes of aerobic activity.  You can split this up into 215-minute segments as you build up your endurance. Dietary goal: Start to use a fitness app such as my fitness pal for lose it to set a calorie goal for each day and to log your nutrition. Medication: None Follow-up: 4 weeks: Goal Weight: 220 lbs.        Abnormal weight gain     Time spent 30 minutes in encounter including reviewing notes.  No orders of the defined types were placed in this encounter.   Follow-up: Return in about 4 weeks (around 09/18/2019) for weight loss .    Beatrice Lecher, MD

## 2019-08-21 NOTE — Assessment & Plan Note (Signed)
Due to recheck enzymes.  I am excited that he is ready to work on losing weight this will make a big impact and there is elevated liver enzymes as well as his cholesterol levels.  We can also plan to recheck those again in 3 to 6 months to see if they are improving as he loses weight.

## 2019-08-21 NOTE — Patient Instructions (Addendum)
Encourage you to work on trying to eat a healthy breakfast option.  I think the frozen biscuits are okay a couple times a week but would encourage you to not eat them daily and to switch to other forms of protein such as eggs or [place] yogurt which is high in protein and usually low in sugar.  For snacks during the day encourage you to try to eat things like vegetables such as pre-cut carrots or nuts.  You can even mix it with a little bit of dried fruit.  Popcorn is a great snack as well because it is mostly low calorie.  Try to really increase your vegetable intake for lunch and dinner.  This will help you walk away from the table feeling more full.  Just try to cut back on carbohydrate intake.  Just really watch her portion sizes on your breads, rice pasta etc.  And try to balance with vegetables.  Vegetables should be half of your plate when you sit down to eat lunch or dinner.   Current Weight: 249 lbs. Exercise goal: Exercise 2 days a week on your days off.  Goal for 30 minutes of aerobic activity.  You can split this up into 215-minute segments as you build up your endurance. Dietary goal: Start to use a fitness app such as my fitness pal for lose it to set a calorie goal for each day and to log your nutrition. Follow-up: 4 weeks:  Goal Weight: 220 lbs.        Obesity, Adult Obesity is having too much body fat. Being obese means that your weight is more than what is healthy for you. BMI is a number that explains how much body fat you have. If you have a BMI of 30 or more, you are obese. Obesity is often caused by eating or drinking more calories than your body uses. Changing your lifestyle can help you lose weight. Obesity can cause serious health problems, such as:  Stroke.  Coronary artery disease (CAD).  Type 2 diabetes.  Some types of cancer, including cancers of the colon, breast, uterus, and gallbladder.  Osteoarthritis.  High blood pressure (hypertension).  High  cholesterol.  Sleep apnea.  Gallbladder stones.  Infertility problems. What are the causes?  Eating meals each day that are high in calories, sugar, and fat.  Being born with genes that may make you more likely to become obese.  Having a medical condition that causes obesity.  Taking certain medicines.  Sitting a lot (having a sedentary lifestyle).  Not getting enough sleep.  Drinking a lot of drinks that have sugar in them. What increases the risk?  Having a family history of obesity.  Being an [place] American woman.  Being a Hispanic man.  Living in an area with limited access to: ? Romilda Garret, recreation centers, or sidewalks. ? Healthy food choices, such as grocery stores and farmers' markets. What are the signs or symptoms? The main sign is having too much body fat. How is this treated?  Treatment for this condition often includes changing your lifestyle. Treatment may include: ? Changing your diet. This may include making a healthy meal plan. ? Exercise. This may include activity that causes your heart to beat faster (aerobic exercise) and strength training. Work with your doctor to design a program that works for you. ? Medicine to help you lose weight. This may be used if you are not able to lose 1 pound a week after 6 weeks of healthy eating  and more exercise. ? Treating conditions that cause the obesity. ? Surgery. Options may include gastric banding and gastric bypass. This may be done if:  Other treatments have not helped to improve your condition.  You have a BMI of 40 or higher.  You have life-threatening health problems related to obesity. Follow these instructions at home: Eating and drinking   Follow advice from your doctor about what to eat and drink. Your doctor may tell you to: ? Limit fast food, sweets, and processed snack foods. ? Choose low-fat options. For example, choose low-fat milk instead of whole milk. ? Eat 5 or more servings of fruits  or vegetables each day. ? Eat at home more often. This gives you more control over what you eat. ? Choose healthy foods when you eat out. ? Learn to read food labels. This will help you learn how much food is in 1 serving. ? Keep low-fat snacks available. ? Avoid drinks that have a lot of sugar in them. These include soda, fruit juice, iced tea with sugar, and flavored milk.  Drink enough water to keep your pee (urine) pale yellow.  Do not go on fad diets. Physical activity  Exercise often, as told by your doctor. Most adults should get up to 150 minutes of moderate-intensity exercise every week.Ask your doctor: ? What types of exercise are safe for you. ? How often you should exercise.  Warm up and stretch before being active.  Do slow stretching after being active (cool down).  Rest between times of being active. Lifestyle  Work with your doctor and a food expert (dietitian) to set a weight-loss goal that is best for you.  Limit your screen time.  Find ways to reward yourself that do not involve food.  Do not drink alcohol if: ? Your doctor tells you not to drink. ? You are pregnant, may be pregnant, or are planning to become pregnant.  If you drink alcohol: ? Limit how much you use to:  0-1 drink a day for women.  0-2 drinks a day for men. ? Be aware of how much alcohol is in your drink. In the [place] one drink equals one 12 oz bottle of beer (355 mL), one 5 oz glass of wine (148 mL), or one 1 oz glass of hard liquor (44 mL). General instructions  Keep a weight-loss journal. This can help you keep track of: ? The food that you eat. ? How much exercise you get.  Take over-the-counter and prescription medicines only as told by your doctor.  Take vitamins and supplements only as told by your doctor.  Think about joining a support group.  Keep all follow-up visits as told by your doctor. This is important. Contact a doctor if:  You cannot meet your weight loss  goal after you have changed your diet and lifestyle for 6 weeks. Get help right away if you:  Are having trouble breathing.  Are having thoughts of harming yourself. Summary  Obesity is having too much body fat.  Being obese means that your weight is more than what is healthy for you.  Work with your doctor to set a weight-loss goal.  Get regular exercise as told by your doctor. This information is not intended to replace advice given to you by your health care provider. Make sure you discuss any questions you have with your health care provider. Document Revised: 01/13/2018 Document Reviewed: 01/13/2018 Elsevier Patient Education  2020 [employer].

## 2019-08-30 ENCOUNTER — Encounter: Payer: Self-pay | Admitting: Rehabilitative and Restorative Service Providers"

## 2019-08-30 ENCOUNTER — Ambulatory Visit (INDEPENDENT_AMBULATORY_CARE_PROVIDER_SITE_OTHER): Payer: BC Managed Care – PPO | Admitting: Rehabilitative and Restorative Service Providers"

## 2019-08-30 ENCOUNTER — Other Ambulatory Visit: Payer: Self-pay

## 2019-08-30 DIAGNOSIS — M25562 Pain in left knee: Secondary | ICD-10-CM | POA: Diagnosis not present

## 2019-08-30 DIAGNOSIS — R29898 Other symptoms and signs involving the musculoskeletal system: Secondary | ICD-10-CM

## 2019-08-30 NOTE — Therapy (Signed)
[facility] [place] [address]  [place] [address] [place], [place], [address] Phone: [phone]   Fax:  [phone]  Physical Therapy Evaluation  Patient Details  Name: David Richards. MRN: [id number] Date of Birth: [birth year] Referring Provider (PT): Dr Benjamin Stain    Encounter Date: 08/30/2019  PT End of Session - 08/30/19 1653    Visit Number  1    Number of Visits  12    Date for PT Re-Evaluation  10/11/19    PT Start Time  1516    PT Stop Time  1603    PT Time Calculation (min)  47 min    Activity Tolerance  Patient tolerated treatment well       Past Medical History:  Diagnosis Date  . ADHD (attention deficit hyperactivity disorder)   . Asthma    exercise induced    History reviewed. No pertinent surgical history.  There were no vitals filed for this visit.   Subjective Assessment - 08/30/19 1527    Subjective  Patient reports Lt knee pai nfor ~ 3 months with no known injury.    Pertinent History  lumbar laminectomy 2018    Patient Stated Goals  get rid of the knee pain    Currently in Pain?  Yes    Pain Score  3     Pain Location  Knee    Pain Orientation  Left    Pain Descriptors / Indicators  Aching    Pain Type  Acute pain    Pain Onset  More than a month ago    Pain Frequency  Intermittent    Aggravating Factors   sit to stand; stand to sit; stairs; walking worse on incline    Pain Relieving Factors  nothing         [facility] PT Assessment - 08/30/19 0001      Assessment   Medical Diagnosis  Lt patellofemoral syndrome     Referring Provider (PT)  Dr Benjamin Stain     Onset Date/Surgical Date  05/26/19    Hand Dominance  Right    Next MD Visit  09/27/19    Prior Therapy  after back surgery       Precautions   Precautions  None      Balance Screen   Has the patient fallen in the past 6 months  No    Has the patient had a decrease in activity level because of a fear of falling?   No    Is the patient reluctant  to leave their home because of a fear of falling?   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     Leisure  computer games -  walking 1-2 times a week x 15-20 min otherwise sedentary       Observation/Other Assessments   Focus on Therapeutic Outcomes (FOTO)   45% limitation       Observation/Other Assessments-Edema    Edema  --   mild edema peripatellar      Sensation   Additional Comments  WFL's per pt report       Posture/Postural Control   Posture Comments  rounded posture       AROM   Right/Left Hip  --  tight hip extension bilat    Right/Left Knee  --   WNL'sflexion - hyperextension 3-5 deg bilat      Strength   Right/Left Hip  --   WFL's bilat    Right/Left Knee  --   WFL's bilat      Flexibility   Hamstrings  tight Lt > Rt     Quadriceps  tight Lt > Rt     ITB  tight Lt > Rt    Piriformis  tight Lt > Rt       Palpation   Patella mobility  lateral tracking patella Lt > Rt     Palpation comment  tightness along the lateral quad into the attachment at the superior Lt patella       Ambulation/Gait   Gait Comments  slight limp Lt LE in weight bearing       Balance   Balance Assessed  --   SLS < 5 sec bilat                Objective measurements completed on examination: See above findings.      [facility] Adult PT Treatment/Exercise - 08/30/19 0001      Knee/Hip Exercises: Stretches   Passive Hamstring Stretch  Left;2 reps;30 seconds   supine with strap    Quad Stretch  Left;2 reps;30 seconds   prone with strap    Hip Flexor Stretch  Left;Right;2 reps;30 seconds   seated elevated table    ITB Stretch  Left;2 reps;30 seconds   supine with strap    Piriformis Stretch  Left;2 reps;30 seconds   supine travell      Manual Therapy   Manual therapy comments   instructed patient in transverse friction massage for insertion of lateral quad Lt superior lateral patella              PT Education - 08/30/19 1559    Education Details  HEP    Person(s) Educated  Patient    Methods  Explanation;Demonstration;Tactile cues;Verbal cues;Handout    Comprehension  Verbalized understanding;Returned demonstration;Verbal cues required;Tactile cues required          PT Long Term Goals - 08/30/19 1658      PT LONG TERM GOAL #1   Title  Decrease pain Lt knee with functional activities by 50-75%    Time  6    Period  Weeks    Status  New    Target Date  10/11/19      PT LONG TERM GOAL #2   Title  Increase LE mobility and tissue extensibility with HS/quad and piriformis musculature demonstrating normal range (i.e. - hamstring flexibility to ~ 75deg  and quad flexibility to heel ~ 4 inches from buttocks)    Time  6    Period  Weeks    Status  New    Target Date  10/11/19      PT LONG TERM GOAL #3   Title  Improve patellar tracking therefore decrease pain to no more than 0/10 to 1/10 with sit to stand and stand to sit; walking; standing for 20 -30 min    Time  6    Period  Weeks    Status  New    Target Date  10/11/19      PT LONG TERM GOAL #4   Title  Independent in HEP    Time  6    Period  Weeks    Status  New    Target  Date  10/11/19      PT LONG TERM GOAL #5   Title  Improve FOTO to </= 26% limitaiton    Time  6    Period  Weeks    Status  New    Target Date  10/11/19             Plan - 08/30/19 1558    Clinical Impression Statement  Patient presents with signs and symptoms consistent with Lt patellofemoral syndrome. He has pain with sit to stand stand to sit; stiars; walking on inclines; squats. He has decresaed ROM Lt > Rt hip and knee; pain with palpation lateral quad and lateral patella; muscuar imbalance Lt quad resulting in lateral tracking patella. Patient will benefit from PT to address problems identified.     Stability/Clinical Decision Making  Stable/Uncomplicated    Clinical Decision Making  Low    Rehab Potential  Good    PT Frequency  2x / week    PT Duration  6 weeks    PT Treatment/Interventions  Patient/family education;ADLs/Self Care Home Management;Cryotherapy;Electrical Stimulation;Iontophoresis 4mg /ml Dexamethasone;Moist Heat;Ultrasound;Gait training;Stair training;Therapeutic activities;Therapeutic exercise;Balance training;Neuromuscular re-education;Manual techniques;Dry needling;Taping    PT Next Visit Plan  review HEP; trial of kinesotaping to correct patellar alignment; strength medial quad/inhabit lateral quad; manual work vs DN lateral thigh/quads;modalities as indicated    PT Home Exercise Plan  [phone]    Consulted and Agree with Plan of Care  Patient       Patient will benefit from skilled therapeutic intervention in order to improve the following deficits and impairments:  Increased fascial restricitons, Decreased activity tolerance, Pain, Decreased mobility, Decreased strength, Postural dysfunction, Improper body mechanics  Visit Diagnosis: Acute pain of left knee - Plan: PT plan of care cert/re-cert  Other symptoms and signs involving the musculoskeletal system - Plan: PT plan of care cert/re-cert     Problem List Patient Active Problem List   Diagnosis Date Noted  . Abnormal weight gain 08/21/2019  . BMI 36.0-36.9,adult 08/21/2019  . Patellofemoral dysfunction, left 08/16/2019  . Tobacco use 01/09/2019  . Elevated liver enzymes 01/09/2019  . Elevated platelet count 01/09/2019  . Lumbar degenerative disc disease 04/08/2017  . Lumbar radiculopathy 11/13/2016  . Dyslexia 03/08/2016  . Low back pain with left-sided sciatica 10/01/2014  . History of high cholesterol 10/01/2014  . Family history of thyroid disease 10/01/2014  . Family history of diabetes mellitus in father 10/01/2014  . Immunization not carried out because of parent refusal 10/01/2014  .  Congenital pes cavus 02/01/2012  . Unilateral weakness 03/19/2011  . Obesity 01/12/2011  . ADHD (attention deficit hyperactivity disorder) 12/25/2010  . Hyperlipidemia 12/25/2010  . Exercise-induced asthma 12/25/2010    David Richards 02/24[birth year]2011 PT, MPH  08/30/2019, 5:04 PM  [facility] [address] [place] [address] [place], [place], [place] Phone: [phone]   Fax:  [phone]  Name: David Richards. MRN: [name] Date of Birth: Feb 18, 2001

## 2019-08-30 NOTE — Patient Instructions (Addendum)
  [email or web address] Access Code: [email or web address]: [email or web address]: 04/07/2021Prepared by: Ayra Hodgdon HoltExercises  Prone Quadriceps Stretch with Strap - 2 x daily - 7 x weekly - 3 reps - 1 sets - 30 sec hold  Hooklying Hamstring Stretch with Strap - 2 x daily - 7 x weekly - 3 reps - 1 sets - 30 sec hold  Supine ITB Stretch with Strap - 2 x daily - 7 x weekly - 3 reps - 1 sets - 30 sec hold  Supine Piriformis Stretch with Leg Straight - 2 x daily - 7 x weekly - 3 reps - 1 sets - 30 sec hold

## 2019-09-01 ENCOUNTER — Ambulatory Visit (INDEPENDENT_AMBULATORY_CARE_PROVIDER_SITE_OTHER): Payer: BC Managed Care – PPO | Admitting: Physical Therapy

## 2019-09-01 ENCOUNTER — Other Ambulatory Visit: Payer: Self-pay

## 2019-09-01 DIAGNOSIS — M25562 Pain in left knee: Secondary | ICD-10-CM | POA: Diagnosis not present

## 2019-09-01 DIAGNOSIS — R29898 Other symptoms and signs involving the musculoskeletal system: Secondary | ICD-10-CM

## 2019-09-01 NOTE — Therapy (Signed)
[facility] [place] [address] [place] [address] [place], [place], [address] Phone: [phone]   Fax:  [phone]  Physical Therapy Treatment  Patient Details  Name: David Richards. MRN: [id number] Date of Birth: [birth year] Referring Provider (PT): Dr Benjamin Stain    Encounter Date: 09/01/2019  PT End of Session - 09/01/19 1603    Visit Number  2    Number of Visits  12    Date for PT Re-Evaluation  10/11/19    PT Start Time  1448    PT Stop Time  1535    PT Time Calculation (min)  47 min    Activity Tolerance  Patient tolerated treatment well    Behavior During Therapy  [facility] for tasks assessed/performed       Past Medical History:  Diagnosis Date  . ADHD (attention deficit hyperactivity disorder)   . Asthma    exercise induced    No past surgical history on file.  There were no vitals filed for this visit.  Subjective Assessment - 09/01/19 1448    Subjective  Pt states that he is having pain in his left knee that bothers him with constant standing up and sitting down at work. He is having trouble finding the time to work through his HEP with working long hours. He says that there is not usually pain at rest.    Pertinent History  lumbar laminectomy 2018    Patient Stated Goals  get rid of the knee pain    Pain Score  4     Pain Location  Knee    Pain Orientation  Anterior;Mid;Left    Pain Descriptors / Indicators  Nagging    Aggravating Factors   any extreme bending, sit to stand, stand to sit.    Pain Relieving Factors  nothing, not a constant pain         [facility] PT Assessment - 09/01/19 0001      Assessment   Medical Diagnosis  Lt patellofemoral syndrome     Referring Provider (PT)  Dr Benjamin Stain     Onset Date/Surgical Date  05/26/19    Hand Dominance  Right    Next MD Visit  09/27/19    Prior Therapy  after back surgery       Flexibility   Soft Tissue Assessment /Muscle Length  yes    Hamstrings  Lt: 59 deg; Rt: 56     Quadriceps  Lt : 119      [facility] Adult PT Treatment/Exercise - 09/01/19 0001      Static Standing Balance   Single Leg Stance - Left Leg  25      Self-Care   Self-Care  Other Self-Care Comments    Other Self-Care Comments   pt educated on self massage with roller stick to LLE to decrease tightness in LE; pt returned demo with cues.       Knee/Hip Exercises: Stretches   Passive Hamstring Stretch  Left;2 reps;20 seconds   supine with strap    Quad Stretch  Left;3 reps;20 seconds   last rep with noodle above knee    Hip Flexor Stretch  Left;2 reps;20 seconds    ITB Stretch  Left;Right;2 reps;20 seconds   supine with strap   Piriformis Stretch  Left;2 reps;Right;1 rep;20 seconds   supine, knee towards opp shoulder, opp knee bent     Knee/Hip Exercises: Standing   Wall Squat  1 set;10 reps;5 seconds   with ball squeeze; cues on  LE alignment   SLS  Lt SLS x 3 reps,  10 sec, 15 sec, 25 sec       Knee/Hip Exercises: Supine   Bridges  1 set;10 reps;Strengthening    Straight Leg Raises  Left;1 set;10 reps    Straight Leg Raise with External Rotation  Left;1 set;5 reps      Manual Therapy   Manual Therapy  Soft tissue mobilization    Soft tissue mobilization  IASTM to Lt distal lateral quad to decrease fascial restrictions and pain.                   PT Long Term Goals - 08/30/19 1658      PT LONG TERM GOAL #1   Title  Decrease pain Lt knee with functional activities by 50-75%    Time  6    Period  Weeks    Status  New    Target Date  10/11/19      PT LONG TERM GOAL #2   Title  Increase LE mobility and tissue extensibility with HS/quad and piriformis musculature demonstrating normal range (i.e. - hamstring flexibility to ~ 75deg  and quad flexibility to heel ~ 4 inches from buttocks)    Time  6    Period  Weeks    Status  New    Target Date  10/11/19      PT LONG TERM GOAL #3   Title  Improve patellar tracking therefore decrease pain to no more than 0/10 to  1/10 with sit to stand and stand to sit; walking; standing for 20 -30 min    Time  6    Period  Weeks    Status  New    Target Date  10/11/19      PT LONG TERM GOAL #4   Title  Independent in HEP    Time  6    Period  Weeks    Status  New    Target Date  10/11/19      PT LONG TERM GOAL #5   Title  Improve FOTO to </= 26% limitaiton    Time  6    Period  Weeks    Status  New    Target Date  10/11/19            Plan - 09/01/19 1603    Clinical Impression Statement  Pt tolerated all exercises without knee pain, except wall slides, where he reported increased in knee towards end of set.  Pain reduced with rest.  Palpable tightness along mid lateral Lt quad. Encouraged compliance with HEP to assist with meeting goals. Goals are ongoing at this time.    Stability/Clinical Decision Making  Stable/Uncomplicated    Rehab Potential  Good    PT Frequency  2x / week    PT Duration  6 weeks    PT Treatment/Interventions  Patient/family education;ADLs/Self Care Home Management;Cryotherapy;Electrical Stimulation;Iontophoresis 4mg /ml Dexamethasone;Moist Heat;Ultrasound;Gait training;Stair training;Therapeutic activities;Therapeutic exercise;Balance training;Neuromuscular re-education;Manual techniques;Dry needling;Taping    PT Next Visit Plan  trial of kinesotaping to correct patellar alignment (if pt shaves knee); strength medial quad/inhabit lateral quad; manual work vs DN lateral thigh/quads;modalities as indicated    PT Home Exercise Plan  [phone]    Consulted and Agree with Plan of Care  Patient       Patient will benefit from skilled therapeutic intervention in order to improve the following deficits and impairments:  Increased fascial restricitons, Decreased activity tolerance, Pain, Decreased mobility, Decreased  strength, Postural dysfunction, Improper body mechanics  Visit Diagnosis: Acute pain of left knee  Other symptoms and signs involving the musculoskeletal  system     Problem List Patient Active Problem List   Diagnosis Date Noted  . Abnormal weight gain 08/21/2019  . BMI 36.0-36.9,adult 08/21/2019  . Patellofemoral dysfunction, left 08/16/2019  . Tobacco use 01/09/2019  . Elevated liver enzymes 01/09/2019  . Elevated platelet count 01/09/2019  . Lumbar degenerative disc disease 04/08/2017  . Lumbar radiculopathy 11/13/2016  . Dyslexia 03/08/2016  . Low back pain with left-sided sciatica 10/01/2014  . History of high cholesterol 10/01/2014  . Family history of thyroid disease 10/01/2014  . Family history of diabetes mellitus in father 10/01/2014  . Immunization not carried out because of parent refusal 10/01/2014  . Congenital pes cavus 02/01/2012  . Unilateral weakness 03/19/2011  . Obesity 01/12/2011  . ADHD (attention deficit hyperactivity disorder) 12/25/2010  . Hyperlipidemia 12/25/2010  . Exercise-induced asthma 12/25/2010   Mayer Camel, PTA 09/01/19 4:12 PM  [facility] [place] [address] [place] [address] [place], [place], [address] Phone: [phone]   Fax:  [phone]  Name: Karandeep Resende. MRN: [id number] Date of Birth: [birth year]

## 2019-09-08 ENCOUNTER — Encounter: Payer: Self-pay | Admitting: Rehabilitative and Restorative Service Providers"

## 2019-09-08 ENCOUNTER — Ambulatory Visit (INDEPENDENT_AMBULATORY_CARE_PROVIDER_SITE_OTHER): Payer: BC Managed Care – PPO | Admitting: Rehabilitative and Restorative Service Providers"

## 2019-09-08 ENCOUNTER — Other Ambulatory Visit: Payer: Self-pay

## 2019-09-08 DIAGNOSIS — M25562 Pain in left knee: Secondary | ICD-10-CM | POA: Diagnosis not present

## 2019-09-08 DIAGNOSIS — R29898 Other symptoms and signs involving the musculoskeletal system: Secondary | ICD-10-CM

## 2019-09-08 NOTE — Patient Instructions (Signed)
Access Code: [email or web address] URL: [email or web address] Date: 09/08/2019 Prepared by: Margretta Ditty  Exercises Prone Quadriceps Stretch with Strap - 2 x daily - 7 x weekly - 3 reps - 1 sets - 30 sec hold Hooklying Hamstring Stretch with Strap - 2 x daily - 7 x weekly - 3 reps - 1 sets - 30 sec hold Supine ITB Stretch with Strap - 2 x daily - 7 x weekly - 3 reps - 1 sets - 30 sec hold Supine Piriformis Stretch with Leg Straight - 2 x daily - 7 x weekly - 3 reps - 1 sets - 30 sec hold Sidelying Hip Abduction - 2 x daily - 7 x weekly - 1 sets - 10 reps Prone Hip Extension - 2 x daily - 7 x weekly - 1 sets - 10 reps Straight Leg Raise with External Rotation - 2 x daily - 7 x weekly - 1 sets - 10 reps  Patient Education Kinesiology tape

## 2019-09-08 NOTE — Therapy (Signed)
[place], [place], [address] Phone: [phone]   Fax:  [phone]  Physical Therapy Treatment  Patient Details  Name: David Richards. MRN: [id number] Date of Birth: [birth year] Referring Provider (PT): Dr Dianah Field    Encounter Date: 09/08/2019  PT End of Session - 09/08/19 1555    Visit Number  3    Number of Visits  12    Date for PT Re-Evaluation  10/11/19    PT Start Time  8841    PT Stop Time  1528    PT Time Calculation (min)  43 min    Activity Tolerance  Patient tolerated treatment well    Behavior During Therapy  [facility] for tasks assessed/performed       Past Medical History:  Diagnosis Date  . ADHD (attention deficit hyperactivity disorder)   . Asthma    exercise induced    History reviewed. No pertinent surgical history.  There were no vitals filed for this visit.  Subjective Assessment - 09/08/19 1445    Subjective  The patient continues with pain when he has been sitting down and rise.  Pain is over the lateral L patella.  Exercises going better this week- took time to try them.  Patient had increased pain after last session x 1 hour    Pertinent History  lumbar laminectomy 2018    Patient Stated Goals  get rid of the knee pain    Currently in Pain?  Yes    Pain Score  3     Pain Location  Knee    Pain Orientation  Left;Lateral    Pain Descriptors / Indicators  Sharp    Pain Type  Acute pain    Pain Onset  More than a month ago    Pain Frequency  Intermittent    Aggravating Factors   sit to stand    Pain Relieving Factors  nothing                       [facility] Adult PT Treatment/Exercise - 09/08/19 1449      Self-Care   Self-Care  Other Self-Care Comments    Other Self-Care Comments   discussed home walking, shoewear, and use of tape      Exercises   Exercises  Knee/Hip      Knee/Hip Exercises: Stretches   Gastroc Stretch  Both;2 reps;30  seconds    Gastroc Stretch Limitations  slant board      Knee/Hip Exercises: Aerobic   Tread Mill  2.2 mph x 3.5 minutes with UE support      Knee/Hip Exercises: Standing   Heel Raises  Both;15 reps    Heel Raises Limitations  tried unilateral L, however unable to move through ROM    Forward Lunges Limitations  isometric hold in lunge R and L foot anterior (switching), performing lengthening through core with mini trunk rotation    Lateral Step Up  Left;10 reps    Lateral Step Up Limitations  4" heel touch with R foot    Step Down  Left;10 reps    Step Down Limitations  4" heel touch down with R LE    Gait Training  *patient demonstrates dec'd ankle ROM during gait leading to increased knee extension for compensation      Knee/Hip Exercises: Supine   Straight Leg Raise with External Rotation  Left;1 set;10 reps      Knee/Hip Exercises: Sidelying  Hip ABduction  Strengthening;Right;Left   12 reps     Knee/Hip Exercises: Prone   Hip Extension  Strengthening;Right;Left;10 reps      Manual Therapy   Manual Therapy  Taping;Soft tissue mobilization    Soft tissue mobilization  STM and IASTM to L distal quad and IT band    Kinesiotex  Facilitate Muscle      Kinesiotix   Facilitate Muscle   to faciitate patellar tracking             PT Education - 09/08/19 1555    Education Details  HEP    Person(s) Educated  Patient    Methods  Explanation;Demonstration;Handout    Comprehension  Verbalized understanding;Returned demonstration          PT Long Term Goals - 08/30/19 1658      PT LONG TERM GOAL #1   Title  Decrease pain Lt knee with functional activities by 50-75%    Time  6    Period  Weeks    Status  New    Target Date  10/11/19      PT LONG TERM GOAL #2   Title  Increase LE mobility and tissue extensibility with HS/quad and piriformis musculature demonstrating normal range (i.e. - hamstring flexibility to ~ 75deg  and quad flexibility to heel ~ 4 inches from  buttocks)    Time  6    Period  Weeks    Status  New    Target Date  10/11/19      PT LONG TERM GOAL #3   Title  Improve patellar tracking therefore decrease pain to no more than 0/10 to 1/10 with sit to stand and stand to sit; walking; standing for 20 -30 min    Time  6    Period  Weeks    Status  New    Target Date  10/11/19      PT LONG TERM GOAL #4   Title  Independent in HEP    Time  6    Period  Weeks    Status  New    Target Date  10/11/19      PT LONG TERM GOAL #5   Title  Improve FOTO to </= 26% limitaiton    Time  6    Period  Weeks    Status  New    Target Date  10/11/19            Plan - 09/08/19 1559    Clinical Impression Statement  The patient has weakness t/o LEs and PT progressed HEP today to work on strengthening.  We discussed need to increase overall activity level to tolerance.  PT also added dynamic tape today for patellar stability.  Plan to continue to progress strengthening to tolerance, encourage greater activity at home, and continue to address impairments working to LTGs.    Stability/Clinical Decision Making  Stable/Uncomplicated    Rehab Potential  Good    PT Frequency  2x / week    PT Duration  6 weeks    PT Treatment/Interventions  Patient/family education;ADLs/Self Care Home Management;Cryotherapy;Electrical Stimulation;Iontophoresis 4mg /ml Dexamethasone;Moist Heat;Ultrasound;Gait training;Stair training;Therapeutic activities;Therapeutic exercise;Balance training;Neuromuscular re-education;Manual techniques;Dry needling;Taping    PT Next Visit Plan  check on tolerance to kinesotaping; strength medial quad/inhabit lateral quad; manual work vs DN lateral thigh/quads;modalities as indicated    PT Home Exercise Plan     Consulted and Agree with Plan of Care  Patient       Patient  will benefit from skilled therapeutic intervention in order to improve the following deficits and impairments:  Increased fascial restricitons, Decreased  activity tolerance, Pain, Decreased mobility, Decreased strength, Postural dysfunction, Improper body mechanics  Visit Diagnosis: Acute pain of left knee  Other symptoms and signs involving the musculoskeletal system     Problem List Patient Active Problem List   Diagnosis Date Noted  . Abnormal weight gain 08/21/2019  . BMI 36.0-36.9,adult 08/21/2019  . Patellofemoral dysfunction, left 08/16/2019  . Tobacco use 01/09/2019  . Elevated liver enzymes 01/09/2019  . Elevated platelet count 01/09/2019  . Lumbar degenerative disc disease 04/08/2017  . Lumbar radiculopathy 11/13/2016  . Dyslexia 03/08/2016  . Low back pain with left-sided sciatica 10/01/2014  . History of high cholesterol 10/01/2014  . Family history of thyroid disease 10/01/2014  . Family history of diabetes mellitus in father 10/01/2014  . Immunization not carried out because of parent refusal 10/01/2014  . Congenital pes cavus 02/01/2012  . Unilateral weakness 03/19/2011  . Obesity 01/12/2011  . ADHD (attention deficit hyperactivity disorder) 12/25/2010  . Hyperlipidemia 12/25/2010  . Exercise-induced asthma 12/25/2010    Adib Wahba,PT 09/08/2019, 4:05 PM  [facility] [address] [place] [address] [place], [place], [address] Phone: [phone]   Fax:  [phone]  Name: David Richards. MRN: [id number] Date of Birth: 11-Sep-2000

## 2019-09-13 ENCOUNTER — Encounter: Payer: Self-pay | Admitting: Physical Therapy

## 2019-09-13 ENCOUNTER — Other Ambulatory Visit: Payer: Self-pay

## 2019-09-13 ENCOUNTER — Ambulatory Visit (INDEPENDENT_AMBULATORY_CARE_PROVIDER_SITE_OTHER): Payer: BC Managed Care – PPO | Admitting: Physical Therapy

## 2019-09-13 DIAGNOSIS — M25562 Pain in left knee: Secondary | ICD-10-CM | POA: Diagnosis not present

## 2019-09-13 DIAGNOSIS — R29898 Other symptoms and signs involving the musculoskeletal system: Secondary | ICD-10-CM | POA: Diagnosis not present

## 2019-09-13 NOTE — Therapy (Signed)
[facility] [place] [address] [place], [place], [address] Phone: [phone]   Fax:  [phone]  Physical Therapy Treatment  Patient Details  Name: David Richards. MRN: [id number] Date of Birth: December 22, 2000 Referring Provider (PT): Dr Benjamin Stain    Encounter Date: 09/13/2019  PT End of Session - 09/13/19 1514    Visit Number  4    Number of Visits  12    Date for PT Re-Evaluation  10/11/19    PT Start Time  1518    PT Stop Time  1606    PT Time Calculation (min)  48 min       Past Medical History:  Diagnosis Date  . ADHD (attention deficit hyperactivity disorder)   . Asthma    exercise induced    History reviewed. No pertinent surgical history.  There were no vitals filed for this visit.  Subjective Assessment - 09/13/19 1522    Subjective  Pt reported taping really helped his knee to feel supported when moving around. Sit to stand is not causing pain as often now. He states that he has been doing his home exercise and seeing improvements.    Currently in Pain?  No/denies    Pain Score  0-No pain    Aggravating Factors   sit to stand but diminishing    Pain Relieving Factors  taping, HEP        [facility] Adult PT Treatment/Exercise - 09/13/19 0001      Knee/Hip Exercises: Stretches   Passive Hamstring Stretch  Left;2 reps;20 seconds   supine with strap; tactile cues needed for alignment   Quad Stretch  Left;3 reps;30 seconds   noodle above knee, prone, with strap   ITB Stretch  Left;Right;2 reps;20 seconds   supine with strap; tactile cues needed for alignment     Knee/Hip Exercises: Aerobic   Elliptical  L1; 4 mins      Knee/Hip Exercises: Standing   Heel Raises  Left;10 reps;3 seconds   Bilat to start for 2 reps, pt tolerated, moved to single   Heel Raises Limitations  tactile cues needed for Lt foot alignment    Forward Lunges  Right;Left;2 sets;5 reps   1 set- 15 sec iso hold, 2nd set- pulses   Hip  Abduction  Left;1 set;10 reps;Knee straight;Stengthening   2 lb cuff weight on Lt ankle   Hip Extension  Left;Stengthening;1 set;10 reps;Knee straight   2 lb cuff weight on Lt LE   Other Standing Knee Exercises  lateral step down, Lt, x 10, 4" step, heel taps wiht Rt foot   tactile cues needed for form, 3 reps   Other Standing Knee Exercises  SL reach to chair, Lt x 10   LOB x 1, CGA  needed 5 reps     Manual Therapy   Manual Therapy  Taping;Soft tissue mobilization    Manual therapy comments  provided taping to Lt lateral knee for improved patellar tracking and stability (K piece of tape with lateral to medial patellar pull)      Soft tissue mobilization  IASTM to lateral and distal Lt quad    Kinesiotex  Ligament Correction        PT Long Term Goals - 09/13/19 1648      PT LONG TERM GOAL #1   Title  Decrease pain Lt knee with functional activities by 50-75%    Time  6    Period  Weeks    Status  New      PT LONG TERM GOAL #2   Title  Increase LE mobility and tissue extensibility with HS/quad and piriformis musculature demonstrating normal range (i.e. - hamstring flexibility to ~ 75deg  and quad flexibility to heel ~ 4 inches from buttocks)    Time  6    Period  Weeks    Status  New      PT LONG TERM GOAL #3   Title  Improve patellar tracking therefore decrease pain to no more than 0/10 to 1/10 with sit to stand and stand to sit; walking; standing for 20 -30 min    Time  6    Period  Weeks    Status  New      PT LONG TERM GOAL #4   Title  Independent in HEP    Time  6    Period  Weeks    Status  New      PT LONG TERM GOAL #5   Title  Improve FOTO to </= 26% limitaiton    Time  6    Period  Weeks    Status  New            Plan - 09/13/19 1641    Clinical Impression Statement   No deviations in observed with patient's gait when entering clinic.Pt tolerated treatment well with no increase of Lt knee pain. Improved compliance with HEP.  Tactile cues were needed  throughout for alignment/ form; pt was able to correct after cues. Pt progressing well towards goals.    Rehab Potential  Good    PT Frequency  2x / week    PT Duration  6 weeks    PT Treatment/Interventions  Patient/family education;ADLs/Self Care Home Management;Cryotherapy;Electrical Stimulation;Iontophoresis 4mg /ml Dexamethasone;Moist Heat;Ultrasound;Gait training;Stair training;Therapeutic activities;Therapeutic exercise;Balance training;Neuromuscular re-education;Manual techniques;Dry needling;Taping    PT Next Visit Plan  strength medial quad/inhabit lateral quad; manual work vs DN lateral thigh/quads;modalities as indicated, ITB stretching/ manual work,    PT Home Exercise Plan  [phone]    Consulted and Agree with Plan of Care  Patient       Patient will benefit from skilled therapeutic intervention in order to improve the following deficits and impairments:  Increased fascial restricitons, Decreased activity tolerance, Pain, Decreased mobility, Decreased strength, Postural dysfunction, Improper body mechanics  Visit Diagnosis: Acute pain of left knee  Other symptoms and signs involving the musculoskeletal system     Problem List Patient Active Problem List   Diagnosis Date Noted  . Abnormal weight gain 08/21/2019  . BMI 36.0-36.9,adult 08/21/2019  . Patellofemoral dysfunction, left 08/16/2019  . Tobacco use 01/09/2019  . Elevated liver enzymes 01/09/2019  . Elevated platelet count 01/09/2019  . Lumbar degenerative disc disease 04/08/2017  . Lumbar radiculopathy 11/13/2016  . Dyslexia 03/08/2016  . Low back pain with left-sided sciatica 10/01/2014  . History of high cholesterol 10/01/2014  . Family history of thyroid disease 10/01/2014  . Family history of diabetes mellitus in father 10/01/2014  . Immunization not carried out because of parent refusal 10/01/2014  . Congenital pes cavus 02/01/2012  . Unilateral weakness 03/19/2011  . Obesity 01/12/2011  .  ADHD (attention deficit hyperactivity disorder) 12/25/2010  . Hyperlipidemia 12/25/2010  . Exercise-induced asthma 12/25/2010    02/24/2011, SPTA 09/13/19 4:53 PM  This entire session was performed under direct supervision and direction of a licensed Physical 09/15/19. I have personally read, edited and approved of the note as written.  Environmental health practitioner  Lorina Rabon, PTA 09/13/19 5:24 PM    [place], [place], [address] Phone: [phone]   Fax:  [phone]  Name: David Richards. MRN: [id number] Date of Birth: 30-Aug-2000

## 2019-09-15 ENCOUNTER — Other Ambulatory Visit: Payer: Self-pay

## 2019-09-15 ENCOUNTER — Ambulatory Visit (INDEPENDENT_AMBULATORY_CARE_PROVIDER_SITE_OTHER): Payer: BC Managed Care – PPO | Admitting: Rehabilitative and Restorative Service Providers"

## 2019-09-15 DIAGNOSIS — R29898 Other symptoms and signs involving the musculoskeletal system: Secondary | ICD-10-CM

## 2019-09-15 DIAGNOSIS — M25562 Pain in left knee: Secondary | ICD-10-CM

## 2019-09-15 NOTE — Therapy (Signed)
[place], [place], [address] Phone: [phone]   Fax:  [phone]  Physical Therapy Treatment  Patient Details  Name: David Richards. MRN: [id number] Date of Birth: 28-Apr-2001 Referring Provider (PT): Dr Dianah Field    Encounter Date: 09/15/2019  PT End of Session - 09/15/19 1217    Visit Number  5    Number of Visits  12    Date for PT Re-Evaluation  10/11/19    PT Start Time  2505    PT Stop Time  3976    PT Time Calculation (min)  47 min    Activity Tolerance  Patient tolerated treatment well    Behavior During Therapy  [facility] for tasks assessed/performed       Past Medical History:  Diagnosis Date  . ADHD (attention deficit hyperactivity disorder)   . Asthma    exercise induced    No past surgical history on file.  There were no vitals filed for this visit.  Subjective Assessment - 09/15/19 1154    Subjective  The patient reports no pain at rest.  He sometimes works 10 hour shifts (for overtime) and notes that this makes it hard to walk for exercise.    Pertinent History  lumbar laminectomy 2018    Patient Stated Goals  get rid of the knee pain    Currently in Pain?  No/denies                       [facility] Adult PT Treatment/Exercise - 09/15/19 1156      Exercises   Exercises  Knee/Hip      Knee/Hip Exercises: Stretches   Active Hamstring Stretch  Right;Left;1 rep;30 seconds    Active Hamstring Stretch Limitations  *in sitting-- significant tightness      Knee/Hip Exercises: Aerobic   Tread Mill  2.5 mph at 1% incline x 4 minutes      Knee/Hip Exercises: Standing   Heel Raises  Both;10 reps;Left    Heel Raises Limitations  both x 10 reps, then L side with tactile cues    Hip Abduction  Stengthening;2 sets    Abduction Limitations  side stepping with red theraband  12 reps    Lateral Step Up  Left;10 reps;Right    Lateral Step Up Limitations  4" heel touch  with R foot    Step Down  Left;10 reps;Right    Step Down Limitations  4" heel touch down with R LE    Functional Squat  10 reps    Functional Squat Limitations  in front of wall for technique cues    Wall Squat  1 set;5 reps      Knee/Hip Exercises: Supine   Straight Leg Raise with External Rotation  Left;Strengthening;10 reps    Straight Leg Raise with External Rotation Limitations  also performed in lob sitting    Other Supine Knee/Hip Exercises  Bridges with feet on ball and rolling ball to/from hips (patient needs demo and cues -- this is challenging for him)    Other Supine Knee/Hip Exercises  Feet on ball alternating R and L lifting x 8 reps      Knee/Hip Exercises: Sidelying   Other Sidelying Knee/Hip Exercises  side planks modified to knees x 5 reps R and L sides      Manual Therapy   Manual Therapy  Taping      Kinesiotix   Facilitate Muscle  to faciitate patellar tracking                  PT Long Term Goals - 09/15/19 1220      PT LONG TERM GOAL #1   Title  Decrease pain Lt knee with functional activities by 50-75%    Baseline  Pain reduced by 75%    Time  6    Period  Weeks    Status  Achieved      PT LONG TERM GOAL #2   Title  Increase LE mobility and tissue extensibility with HS/quad and piriformis musculature demonstrating normal range (i.e. - hamstring flexibility to ~ 75deg  and quad flexibility to heel ~ 4 inches from buttocks)    Time  6    Period  Weeks    Status  On-going      PT LONG TERM GOAL #3   Title  Improve patellar tracking therefore decrease pain to no more than 0/10 to 1/10 with sit to stand and stand to sit; walking; standing for 20 -30 min    Time  6    Period  Weeks    Status  On-going      PT LONG TERM GOAL #4   Title  Independent in HEP    Time  6    Period  Weeks    Status  On-going      PT LONG TERM GOAL #5   Title  Improve FOTO to </= 26% limitaiton    Time  6    Period  Weeks    Status  On-going             Plan - 09/15/19 1243    Clinical Impression Statement  The patient has met LTG for pain reduction.  PT continuing to work on strengthening (core and LEs) and progressing to patient tolerance.  Focused on increasing eccentric control quads, gastroc strengthening and lateral hips.    PT Treatment/Interventions  Patient/family education;ADLs/Self Care Home Management;Cryotherapy;Electrical Stimulation;Iontophoresis 39m/ml Dexamethasone;Moist Heat;Ultrasound;Gait training;Stair training;Therapeutic activities;Therapeutic exercise;Balance training;Neuromuscular re-education;Manual techniques;Dry needling;Taping    PT Next Visit Plan  strength medial quad/inhabit lateral quad; manual work vs DN lateral thigh/quads;modalities as indicated, ITB stretching/ manual work, SDatabase administrator   PT Home Exercise Plan  [phone]   Consulted and Agree with Plan of Care  Patient       Patient will benefit from skilled therapeutic intervention in order to improve the following deficits and impairments:     Visit Diagnosis: Acute pain of left knee  Other symptoms and signs involving the musculoskeletal system     Problem List Patient Active Problem List   Diagnosis Date Noted  . Abnormal weight gain 08/21/2019  . BMI 36.0-36.9,adult 08/21/2019  . Patellofemoral dysfunction, left 08/16/2019  . Tobacco use 01/09/2019  . Elevated liver enzymes 01/09/2019  . Elevated platelet count 01/09/2019  . Lumbar degenerative disc disease 04/08/2017  . Lumbar radiculopathy 11/13/2016  . Dyslexia 03/08/2016  . Low back pain with left-sided sciatica 10/01/2014  . History of high cholesterol 10/01/2014  . Family history of thyroid disease 10/01/2014  . Family history of diabetes mellitus in father 10/01/2014  . Immunization not carried out because of parent refusal 10/01/2014  . Congenital pes cavus 02/01/2012  . Unilateral weakness 03/19/2011  . Obesity 01/12/2011  . ADHD (attention deficit hyperactivity  disorder) 12/25/2010  . Hyperlipidemia 12/25/2010  . Exercise-induced asthma 12/25/2010    WEAVER,CHRISTINA, PT 09/15/2019, 12:45 PM  [facility] [place], [place], [address] Phone: [phone]   Fax:  [phone]  Name: Daymond Cordts. MRN: [id number] Date of Birth: [birth year]

## 2019-09-18 ENCOUNTER — Other Ambulatory Visit: Payer: Self-pay

## 2019-09-18 ENCOUNTER — Ambulatory Visit (INDEPENDENT_AMBULATORY_CARE_PROVIDER_SITE_OTHER): Payer: BC Managed Care – PPO | Admitting: Rehabilitative and Restorative Service Providers"

## 2019-09-18 ENCOUNTER — Encounter: Payer: Self-pay | Admitting: Rehabilitative and Restorative Service Providers"

## 2019-09-18 DIAGNOSIS — M25562 Pain in left knee: Secondary | ICD-10-CM

## 2019-09-18 DIAGNOSIS — R29898 Other symptoms and signs involving the musculoskeletal system: Secondary | ICD-10-CM | POA: Diagnosis not present

## 2019-09-18 NOTE — Therapy (Signed)
[place], [place], [address] Phone: [phone]   Fax:  [phone]  Physical Therapy Treatment  Patient Details  Name: David Richards. MRN: [id number] Date of Birth: 05/29/2000 Referring Provider (PT): Dr Dianah Field    Encounter Date: 09/18/2019  PT End of Session - 09/18/19 1351    Visit Number  6    Number of Visits  12    Date for PT Re-Evaluation  10/11/19    PT Start Time  5825    PT Stop Time  1426    PT Time Calculation (min)  38 min    Activity Tolerance  Patient tolerated treatment well    Behavior During Therapy  [facility] for tasks assessed/performed       Past Medical History:  Diagnosis Date  . ADHD (attention deficit hyperactivity disorder)   . Asthma    exercise induced    History reviewed. No pertinent surgical history.  There were no vitals filed for this visit.  Subjective Assessment - 09/18/19 1350    Subjective  The patient has not had any pain over the weekend.  He was more active overall and gets no pain with sit<>stand or after sitting for longer periods.    Pertinent History  lumbar laminectomy 2018    Patient Stated Goals  get rid of the knee pain    Currently in Pain?  No/denies                       [facility] Adult PT Treatment/Exercise - 09/18/19 1356      Self-Care   Self-Care  Other Self-Care Comments    Other Self-Care Comments   removed kinesio tape and recommended we trial 3 days without tape to assess if he has pain (will teach how to self tape while continuing strengthening if needed)      Exercises   Exercises  Knee/Hip      Knee/Hip Exercises: Stretches   Active Hamstring Stretch  Right;Left;2 reps;30 seconds    Other Knee/Hip Stretches  HS stretch in modified down dog    Other Knee/Hip Stretches  dog dog stretch beginning in quadriped and pressing up for posterior chain stretching (challenging due to upper body weakness)      Knee/Hip Exercises: Aerobic   Tread Mill  2.5 mph x 4 minutes      Knee/Hip Exercises: Standing   Heel Raises  Both;10 reps    Side Lunges  Right;Left;10 reps    Side Lunges Limitations  standing pillow case slides to the R and L    Hip Abduction  Stengthening;2 sets    Abduction Limitations  side stepping with red theraband  12 reps    Lateral Step Up  Right;Left;10 reps      Knee/Hip Exercises: Supine   Other Supine Knee/Hip Exercises  Bridges with feet on ball, then rolling ball to/from hips x 3 reps    Other Supine Knee/Hip Exercises  Feet on ball alternating R and L lifting x 8 reps      Knee/Hip Exercises: Sidelying   Other Sidelying Knee/Hip Exercises  --             PT Education - 09/18/19 1420    Education Details  HEP  modified    Person(s) Educated  Patient    Methods  Explanation;Demonstration;Handout    Comprehension  Verbalized understanding;Returned demonstration          PT Long Term  Goals - 09/18/19 1351      PT LONG TERM GOAL #1   Title  Decrease pain Lt knee with functional activities by 50-75%    Baseline  Pain reduced by 75%    Time  6    Period  Weeks    Status  Achieved      PT LONG TERM GOAL #2   Title  Increase LE mobility and tissue extensibility with HS/quad and piriformis musculature demonstrating normal range (i.e. - hamstring flexibility to ~ 75deg  and quad flexibility to heel ~ 4 inches from buttocks)    Time  6    Period  Weeks    Status  On-going      PT LONG TERM GOAL #3   Title  Improve patellar tracking therefore decrease pain to no more than 0/10 to 1/10 with sit to stand and stand to sit; walking; standing for 20 -30 min    Time  6    Period  Weeks    Status  Achieved      PT LONG TERM GOAL #4   Title  Independent in HEP    Time  6    Period  Weeks    Status  On-going      PT LONG TERM GOAL #5   Title  Improve FOTO to </= 26% limitaiton    Baseline  23% limitation on 4/26    Time  6    Period  Weeks    Status   Achieved            Plan - 09/18/19 1429    Clinical Impression Statement  The patient has met 3 LTGs.  PT recommended we trial no tape to determine if he has pain while progressing ther ex.  PT re-emphasized need for regular HS stretching and strengthening.    PT Treatment/Interventions  Patient/family education;ADLs/Self Care Home Management;Cryotherapy;Electrical Stimulation;Iontophoresis '4mg'$ /ml Dexamethasone;Moist Heat;Ultrasound;Gait training;Stair training;Therapeutic activities;Therapeutic exercise;Balance training;Neuromuscular re-education;Manual techniques;Dry needling;Taping    PT Next Visit Plan  strength medial quad/inhabit lateral quad;  ITB stretching/ manual work, SL balance, taping for home as needed    PT Home Exercise Plan  [phone]    Consulted and Agree with Plan of Care  Patient       Patient will benefit from skilled therapeutic intervention in order to improve the following deficits and impairments:     Visit Diagnosis: Acute pain of left knee  Other symptoms and signs involving the musculoskeletal system     Problem List Patient Active Problem List   Diagnosis Date Noted  . Abnormal weight gain 08/21/2019  . BMI 36.0-36.9,adult 08/21/2019  . Patellofemoral dysfunction, left 08/16/2019  . Tobacco use 01/09/2019  . Elevated liver enzymes 01/09/2019  . Elevated platelet count 01/09/2019  . Lumbar degenerative disc disease 04/08/2017  . Lumbar radiculopathy 11/13/2016  . Dyslexia 03/08/2016  . Low back pain with left-sided sciatica 10/01/2014  . History of high cholesterol 10/01/2014  . Family history of thyroid disease 10/01/2014  . Family history of diabetes mellitus in father 10/01/2014  . Immunization not carried out because of parent refusal 10/01/2014  . Congenital pes cavus 02/01/2012  . Unilateral weakness 03/19/2011  . Obesity 01/12/2011  . ADHD (attention deficit hyperactivity disorder) 12/25/2010  . Hyperlipidemia 12/25/2010  .  Exercise-induced asthma 12/25/2010    Emunah Texidor , PT 09/18/2019, 4:19 PM  [facility] [place], [place], [address] Phone: [phone]   Fax:  [phone]  Name: Garritt Molyneux. MRN: [id number] Date of Birth: 01/21/2001

## 2019-09-18 NOTE — Patient Instructions (Signed)
Access Code: [email or web address] URL: [email or web address] Date: 09/18/2019 Prepared by: Margretta Ditty  Exercises Prone Quadriceps Stretch with Strap - 2 x daily - 7 x weekly - 3 reps - 1 sets - 30 sec hold Hooklying Hamstring Stretch with Strap - 2 x daily - 7 x weekly - 3 reps - 1 sets - 30 sec hold Supine ITB Stretch with Strap - 2 x daily - 7 x weekly - 3 reps - 1 sets - 30 sec hold Supine Piriformis Stretch with Leg Straight - 2 x daily - 7 x weekly - 3 reps - 1 sets - 30 sec hold Prone Hip Extension - 2 x daily - 7 x weekly - 1 sets - 10 reps Straight Leg Raise with External Rotation - 2 x daily - 7 x weekly - 1 sets - 10 reps Side Stepping with Resistance at Thighs - 2 x daily - 7 x weekly - 10 reps - 1 sets

## 2019-09-19 ENCOUNTER — Ambulatory Visit (INDEPENDENT_AMBULATORY_CARE_PROVIDER_SITE_OTHER): Payer: BC Managed Care – PPO | Admitting: Family Medicine

## 2019-09-19 ENCOUNTER — Encounter: Payer: Self-pay | Admitting: Family Medicine

## 2019-09-19 VITALS — BP 125/74 | HR 77 | Ht 69.0 in | Wt 245.0 lb

## 2019-09-19 DIAGNOSIS — Z6836 Body mass index (BMI) 36.0-36.9, adult: Secondary | ICD-10-CM | POA: Diagnosis not present

## 2019-09-19 DIAGNOSIS — R748 Abnormal levels of other serum enzymes: Secondary | ICD-10-CM | POA: Diagnosis not present

## 2019-09-19 DIAGNOSIS — R635 Abnormal weight gain: Secondary | ICD-10-CM | POA: Diagnosis not present

## 2019-09-19 NOTE — Assessment & Plan Note (Signed)
And to recheck liver enzymes at his follow-up at the end of June.

## 2019-09-19 NOTE — Assessment & Plan Note (Signed)
Really excited about his weight loss he is lost 4 pounds in 4 weeks.  Just encouraged him to continue to work on these changes that he is made.  I really want him to focus on increasing vegetables in his diet and creating some type of exercise routine and really like him to try to get in 20 to 30 minutes 3 days a week between now and when I see him back.  We will follow up in 4 weeks.  He is not currently on prescription medication for weight loss.  His blood pressure looks fantastic today and is down from previous.  I am hopeful that his liver enzymes will come down as well.

## 2019-09-19 NOTE — Progress Notes (Signed)
Established Patient Office Visit  Subjective:  Patient ID: David Richards., male    DOB: [birth year]  Age: 19 y.o. MRN: [id number]  CC:  Chief Complaint  Patient presents with  . Weight Check    HPI David Richards. presents for follow-up of abnormal weight gain and elevated liver enzymes.  He is actually doing well since I last saw him.  He is actually lost about 4 pounds.  He said he actually had lost a little bit more than that but then gained back a couple of pounds.  He says he started eating yogurt a little bit more regularly and has been doing a protein drink supplement.  He has been eating more fruit and is cut out some the carbohydrates he is really cut out soda.  He says he still is really struggling to get his vegetables and really just are not very many that he likes.  And he still struggling to get into a routine exercise plan.  He usually works from 6 AM to 4:30 PM.  He does have a physically active job but by the time he gets home he is exhausted.  He says he drinks plenty of water daily.  Past Medical History:  Diagnosis Date  . ADHD (attention deficit hyperactivity disorder)   . Asthma    exercise induced    No past surgical history on file.  Family History  Problem Relation Age of Onset  . Hypertension Father   . Obesity Father     Social History   Socioeconomic History  . Marital status: Single    Spouse name: Not on file  . Number of children: Not on file  . Years of education: Not on file  . Highest education level: Not on file  Occupational History  . Occupation: Consulting civil engineer  Tobacco Use  . Smoking status: Never Smoker  . Smokeless tobacco: Never Used  Substance and Sexual Activity  . Alcohol use: No  . Drug use: No  . Sexual activity: Not on file  Other Topics Concern  . Not on file  Social History Narrative   Not the best diet.    Social Determinants of Health   Financial Resource Strain:   . Difficulty of Paying Living Expenses:   Food  Insecurity:   . Worried About Programme researcher, broadcasting/film/video in the Last Year:   . Barista in the Last Year:   Transportation Needs:   . Freight forwarder (Medical):   Marland Kitchen Lack of Transportation (Non-Medical):   Physical Activity:   . Days of Exercise per Week:   . Minutes of Exercise per Session:   Stress:   . Feeling of Stress :   Social Connections:   . Frequency of Communication with Friends and Family:   . Frequency of Social Gatherings with Friends and Family:   . Attends Religious Services:   . Active Member of Clubs or Organizations:   . Attends Banker Meetings:   Marland Kitchen Marital Status:   Intimate Partner Violence:   . Fear of Current or Ex-Partner:   . Emotionally Abused:   Marland Kitchen Physically Abused:   . Sexually Abused:     Outpatient Medications Prior to Visit  Medication Sig Dispense Refill  . meloxicam (MOBIC) 15 MG tablet One tab PO qAM with a meal for 2 weeks, then daily prn pain. 30 tablet 3  . SODIUM FLUORIDE 5000 SENSITIVE 1.1-5 % PSTE USE TO BRUSH TEETH TWICE A  DAY *DO NOT RINSE, EAT DRINK AFER BRUSHING FOR 30 MINUTES*    . clindamycin (CLEOCIN) 300 MG capsule Take 300 mg by mouth every 8 (eight) hours.    . clotrimazole-betamethasone (LOTRISONE) cream Apply 1 application topically 2 (two) times daily. (Patient not taking: Reported on 08/30/2019) 45 g 0   No facility-administered medications prior to visit.    Allergies  Allergen Reactions  . Amoxicillin-Pot Clavulanate Other (See Comments)    Abdominal pain and cramping.   . Ampicillin Diarrhea    ROS Review of Systems    Objective:    Physical Exam  BP 125/74   Pulse 77   Ht 5\' 9"  (1.753 m)   Wt 245 lb (111.1 kg)   SpO2 100%   BMI 36.18 kg/m  Wt Readings from Last 3 Encounters:  09/19/19 245 lb (111.1 kg)  08/21/19 249 lb 1.9 oz (113 kg)  01/09/19 248 lb (112.5 kg)     There are no preventive care reminders to display for this patient.  There are no preventive care reminders to  display for this patient.  Lab Results  Component Value Date   TSH 1.108 01/09/2014   Lab Results  Component Value Date   WBC 11.5 (H) 12/16/2018   HGB 15.1 12/16/2018   HCT 45.2 12/16/2018   MCV 85.6 12/16/2018   PLT 414 (H) 12/16/2018   Lab Results  Component Value Date   NA 139 08/21/2019   K 4.8 08/21/2019   CO2 29 08/21/2019   GLUCOSE 87 08/21/2019   BUN 14 08/21/2019   CREATININE 0.86 08/21/2019   BILITOT 0.5 08/21/2019   ALKPHOS 134 07/16/2014   AST 32 08/21/2019   ALT 66 (H) 08/21/2019   PROT 7.4 08/21/2019   ALBUMIN 5.0 07/16/2014   CALCIUM 10.2 08/21/2019   Lab Results  Component Value Date   CHOL 211 (H) 12/16/2018   Lab Results  Component Value Date   HDL 35 (L) 12/16/2018   Lab Results  Component Value Date   LDLCALC 158 (H) 12/16/2018   Lab Results  Component Value Date   TRIG 81 12/16/2018   Lab Results  Component Value Date   CHOLHDL 6.0 (H) 12/16/2018   Lab Results  Component Value Date   HGBA1C 5.3 07/16/2014      Assessment & Plan:   Problem List Items Addressed This Visit      Other   Elevated liver enzymes    And to recheck liver enzymes at his follow-up at the end of June.      BMI 36.0-36.9,adult - Primary    Current Weight: 245 lbs. Change in Weight: down 4 lbs.  Exercise goal: Exercise 3 days a week on your days off.  Goal for 30 minutes of aerobic activity.  You can split this up into 2, 15-minute segments as you build up your endurance. Dietary goal: Start to use a fitness app such as my fitness pal for lose it to set a calorie goal for each day and to log your nutrition.  Discussed really working on increasing vegetables and decreasing fruit. Medication: None Follow-up: 4 weeks Goal Weight: 220 lbs.        Abnormal weight gain    Really excited about his weight loss he is lost 4 pounds in 4 weeks.  Just encouraged him to continue to work on these changes that he is made.  I really want him to focus on increasing  vegetables in his diet and creating some type  of exercise routine and really like him to try to get in 20 to 30 minutes 3 days a week between now and when I see him back.  We will follow up in 4 weeks.  He is not currently on prescription medication for weight loss.  His blood pressure looks fantastic today and is down from previous.  I am hopeful that his liver enzymes will come down as well.         No orders of the defined types were placed in this encounter.   Follow-up: Return in about 4 weeks (around 10/17/2019) for WEight check.    Beatrice Lecher, MD

## 2019-09-19 NOTE — Assessment & Plan Note (Signed)
Current Weight: 245 lbs. Change in Weight: down 4 lbs.  Exercise goal: Exercise 3 days a week on your days off.  Goal for 30 minutes of aerobic activity.  You can split this up into 2, 15-minute segments as you build up your endurance. Dietary goal: Start to use a fitness app such as my fitness pal for lose it to set a calorie goal for each day and to log your nutrition.  Discussed really working on increasing vegetables and decreasing fruit. Medication: None Follow-up: 4 weeks Goal Weight: 220 lbs.

## 2019-09-19 NOTE — Patient Instructions (Signed)
Continue with your current changes and in addition continue to work on trying to increase your vegetable intake try some different ways of getting them and try some different vegetables that may be you have tried in a long time.  Be careful about how much fruit you are eating.  Great job in cutting out the soda and increasing your protein.  Shoot for 30 minutes of exercise at least 3 days a week.  Try to play around with the timing of your exercise to see what works for you and what might help you be more consistent and more successful.

## 2019-09-21 ENCOUNTER — Encounter: Payer: Self-pay | Admitting: Rehabilitative and Restorative Service Providers"

## 2019-09-21 ENCOUNTER — Ambulatory Visit (INDEPENDENT_AMBULATORY_CARE_PROVIDER_SITE_OTHER): Payer: BC Managed Care – PPO | Admitting: Rehabilitative and Restorative Service Providers"

## 2019-09-21 ENCOUNTER — Other Ambulatory Visit: Payer: Self-pay

## 2019-09-21 DIAGNOSIS — R29898 Other symptoms and signs involving the musculoskeletal system: Secondary | ICD-10-CM

## 2019-09-21 DIAGNOSIS — M25562 Pain in left knee: Secondary | ICD-10-CM

## 2019-09-21 NOTE — Therapy (Signed)
[facility] [place] [address] [place], [place], [address] Phone: [phone]   Fax:  [phone]  Physical Therapy Treatment  Patient Details  Name: David Richards. MRN: [id number] Date of Birth: 2001/03/15 Referring Provider (PT): Dr Benjamin Stain    Encounter Date: 09/21/2019  PT End of Session - 09/21/19 1640    Visit Number  7    Number of Visits  12    Date for PT Re-Evaluation  10/11/19    PT Start Time  1618    PT Stop Time  1700    PT Time Calculation (min)  42 min    Activity Tolerance  Patient tolerated treatment well    Behavior During Therapy  [facility] for tasks assessed/performed       Past Medical History:  Diagnosis Date  . ADHD (attention deficit hyperactivity disorder)   . Asthma    exercise induced    History reviewed. No pertinent surgical history.  There were no vitals filed for this visit.  Subjective Assessment - 09/21/19 1618    Subjective  The patient noticed increased pain in R knee when he transitions sit>stand without using tape.    Pertinent History  lumbar laminectomy 2018    Patient Stated Goals  get rid of the knee pain    Currently in Pain?  No/denies                       [facility] Adult PT Treatment/Exercise - 09/21/19 1619      Self-Care   Self-Care  Other Self-Care Comments    Other Self-Care Comments   discussed type of kinesio tape (3" dynamic tape) for order online.  Discussed temporary use of tape while progressing strengthening.      Exercises   Exercises  Knee/Hip      Knee/Hip Exercises: Stretches   Active Hamstring Stretch  Right;Left;2 reps;30 seconds    Gastroc Stretch  Both;30 seconds;1 rep    Other Knee/Hip Stretches  seated butterfly stretch x 30 second holds (on 2 pillows to raise hips)      Knee/Hip Exercises: Aerobic   Tread Mill  2. x 4 minutes      Knee/Hip Exercises: Standing   Heel Raises  Right;Left;10 reps    Functional Squat  5 reps     Functional Squat Limitations  in front of wall working on hip hinges and knee bends (wall limits depth of squat)    Wall Squat  1 set;5 reps    Wall Squat Limitations  added ball squeeze working on isometric holds with ball squeeze    Other Standing Knee Exercises  Standing isometric lunge x 30 second holds x 1 reps each leg      Knee/Hip Exercises: Seated   Stool Scoot - Round Trips  2 reps x 75 feet    Ball Squeeze  Seated ball squeeze for adductors x 10 reps x 5 second holds      Knee/Hip Exercises: Sidelying   Other Sidelying Knee/Hip Exercises  side plank modified to knees x 5 reps R and L sides      Knee/Hip Exercises: Prone   Other Prone Exercises  prone 8 point plank for core engagement x 10 reps x 5 second holds                  PT Long Term Goals - 09/18/19 1351      PT LONG TERM GOAL #1   Title  Decrease pain Lt knee with functional activities by 50-75%    Baseline  Pain reduced by 75%    Time  6    Period  Weeks    Status  Achieved      PT LONG TERM GOAL #2   Title  Increase LE mobility and tissue extensibility with HS/quad and piriformis musculature demonstrating normal range (i.e. - hamstring flexibility to ~ 75deg  and quad flexibility to heel ~ 4 inches from buttocks)    Time  6    Period  Weeks    Status  On-going      PT LONG TERM GOAL #3   Title  Improve patellar tracking therefore decrease pain to no more than 0/10 to 1/10 with sit to stand and stand to sit; walking; standing for 20 -30 min    Time  6    Period  Weeks    Status  Achieved      PT LONG TERM GOAL #4   Title  Independent in HEP    Time  6    Period  Weeks    Status  On-going      PT LONG TERM GOAL #5   Title  Improve FOTO to </= 26% limitaiton    Baseline  23% limitation on 4/26    Time  6    Period  Weeks    Status  Achieved            Plan - 09/21/19 1811    Clinical Impression Statement  The patient notes some discomfort in the L knee when not using tape  (for trial) this week.  We discussed that tape can be used short term while we increase and progress strengthening.  PT emphasized need for LE strenthening and exercising 3 days/week at home.    PT Treatment/Interventions  Patient/family education;ADLs/Self Care Home Management;Cryotherapy;Electrical Stimulation;Iontophoresis 4mg /ml Dexamethasone;Moist Heat;Ultrasound;Gait training;Stair training;Therapeutic activities;Therapeutic exercise;Balance training;Neuromuscular re-education;Manual techniques;Dry needling;Taping    PT Next Visit Plan  strength medial quad/inhabit lateral quad;  ITB stretching/ manual work, SL balance, taping for home as needed    PT Home Exercise Plan  [phone]    Consulted and Agree with Plan of Care  Patient       Patient will benefit from skilled therapeutic intervention in order to improve the following deficits and impairments:  Increased fascial restricitons, Decreased activity tolerance, Pain, Decreased mobility, Decreased strength, Postural dysfunction, Improper body mechanics  Visit Diagnosis: Acute pain of left knee  Other symptoms and signs involving the musculoskeletal system     Problem List Patient Active Problem List   Diagnosis Date Noted  . Abnormal weight gain 08/21/2019  . BMI 36.0-36.9,adult 08/21/2019  . Patellofemoral dysfunction, left 08/16/2019  . Tobacco use 01/09/2019  . Elevated liver enzymes 01/09/2019  . Elevated platelet count 01/09/2019  . Lumbar degenerative disc disease 04/08/2017  . Lumbar radiculopathy 11/13/2016  . Dyslexia 03/08/2016  . Low back pain with left-sided sciatica 10/01/2014  . History of high cholesterol 10/01/2014  . Family history of thyroid disease 10/01/2014  . Family history of diabetes mellitus in father 10/01/2014  . Immunization not carried out because of parent refusal 10/01/2014  . Congenital pes cavus 02/01/2012  . Unilateral weakness 03/19/2011  . Obesity 01/12/2011  . ADHD (attention deficit  hyperactivity disorder) 12/25/2010  . Hyperlipidemia 12/25/2010  . Exercise-induced asthma 12/25/2010    Alyssabeth Bruster,PT 09/21/2019, 6:13 PM  [facility] [address] [place], [place], [place]  Phone: [phone]   Fax:  [phone]  Name: Priscille Loveless. MRN: [id number] Date of Birth: [birth year]

## 2019-09-25 ENCOUNTER — Encounter: Payer: Self-pay | Admitting: Rehabilitative and Restorative Service Providers"

## 2019-09-25 ENCOUNTER — Other Ambulatory Visit: Payer: Self-pay

## 2019-09-25 ENCOUNTER — Ambulatory Visit (INDEPENDENT_AMBULATORY_CARE_PROVIDER_SITE_OTHER): Payer: BC Managed Care – PPO | Admitting: Rehabilitative and Restorative Service Providers"

## 2019-09-25 DIAGNOSIS — M25562 Pain in left knee: Secondary | ICD-10-CM

## 2019-09-25 DIAGNOSIS — R29898 Other symptoms and signs involving the musculoskeletal system: Secondary | ICD-10-CM | POA: Diagnosis not present

## 2019-09-25 NOTE — Patient Instructions (Signed)
Dynamic tape 3" (can order on amazon)  *see your video to use tape  HOME EXERCISE OPTIONS: 1) Walking x 30 minutes 3 days/week.  You can do 2 times over weekend and pick 1 time mid week   2) Yoga with adriene for hamstrings (you tube) *Begin slowly and do what is comfortable for you  3) Peloton trial app free for 60 days (if they are still running this promotion) Use this for power walking, core strengthening, yoga, or stretching classes.  They have strengthening options too.  Begin slowly and work based on your tolerance.    Start slowly and make one change at a time.  Exercise is the key to improve energy and decrease pain.

## 2019-09-25 NOTE — Therapy (Addendum)
[facility] [place], [place], [address] Phone: [phone]   Fax:  [phone]  Physical Therapy Treatment/ on Hold Note/ Discharge Summary  Patient Details  Name: David Richards. MRN: [id number] Date of Birth: [birth year] Referring Provider (PT): Dr Dianah Field    Encounter Date: 09/25/2019  PT End of Session - 09/25/19 1518    Visit Number  8    Number of Visits  12    Date for PT Re-Evaluation  10/11/19    PT Start Time  8182    PT Stop Time  1605    PT Time Calculation (min)  50 min    Activity Tolerance  Patient tolerated treatment well    Behavior During Therapy  [facility] for tasks assessed/performed       Past Medical History:  Diagnosis Date  . ADHD (attention deficit hyperactivity disorder)   . Asthma    exercise induced    History reviewed. No pertinent surgical history.  There were no vitals filed for this visit.  Subjective Assessment - 09/25/19 1517    Subjective  The patient reports with tape on knee he has no pain.  We trialed no tape last week with return of mild pain when transitioning sit>stand.  Patient did yard work Media planner) over the weekend.  He did not find time to walk.    Pertinent History  lumbar laminectomy 2018    Patient Stated Goals  get rid of the knee pain    Currently in Pain?  No/denies                       [facility] Adult PT Treatment/Exercise - 09/25/19 1519      Self-Care   Self-Care  Other Self-Care Comments    Other Self-Care Comments   see patient education for specific generalized exercise recommendations      Exercises   Exercises  Knee/Hip      Knee/Hip Exercises: Stretches   Active Hamstring Stretch  Right;Left;5 reps;20 seconds    Passive Hamstring Stretch  Right;Left;3 reps;30 seconds    Quad Stretch  Right;Left;5 reps    Other Knee/Hip Stretches  Standing modified down dog for posterior chain stretching, then performed forward fold  at mat with trunk rotation.      Knee/Hip Exercises: Aerobic   Tread Mill  2.2 mphs @ 2% incline x 5 minutes      Knee/Hip Exercises: Standing   Heel Raises  Both;10 reps    Heel Raises Limitations  without UEs holding 3 seconds    Side Lunges  Right;Left;5 reps    Side Lunges Limitations  standing mini lunges with side stepping    Functional Squat Limitations  *trialed for portion of ROM and led to pain over middle patella/ modified to wall squat    Wall Squat  1 set;5 reps    Wall Squat Limitations  with isometric holds x 10 seconds      Knee/Hip Exercises: Sidelying   Other Sidelying Knee/Hip Exercises  modified side plank on knees x 5 reps R and L sides      Knee/Hip Exercises: Prone   Other Prone Exercises  prone 8 point plank for core engagement and hips x 8 reps with 5 second holds    Other Prone Exercises  Quadriped hip extension x 10 repetitions R and L side             PT Education - 09/25/19 1614  Education Details  educated patient on taping for patellofemoral tracking (video for home), and beginning general conditioning program.    Person(s) Educated  Patient    Methods  Explanation;Demonstration;Handout    Comprehension  Verbalized understanding;Returned demonstration          PT Long Term Goals - 09/25/19 1613      PT LONG TERM GOAL #1   Title  Decrease pain Lt knee with functional activities by 50-75%    Baseline  Pain reduced by 75%    Time  6    Period  Weeks    Status  Achieved    Target Date  10/11/19      PT LONG TERM GOAL #2   Title  Increase LE mobility and tissue extensibility with HS/quad and piriformis musculature demonstrating normal range (i.e. - hamstring flexibility to ~ 75deg  and quad flexibility to heel ~ 4 inches from buttocks)    Time  6    Period  Weeks    Status  On-going      PT LONG TERM GOAL #3   Title  Improve patellar tracking therefore decrease pain to no more than 0/10 to 1/10 with sit to stand and stand to sit;  walking; standing for 20 -30 min    Time  6    Period  Weeks    Status  Achieved      PT LONG TERM GOAL #4   Title  Independent in HEP    Time  6    Period  Weeks    Status  Achieved      PT LONG TERM GOAL #5   Title  Improve FOTO to </= 26% limitaiton    Baseline  23% limitation on 4/26    Time  6    Period  Weeks    Status  Achieved            Plan - 09/25/19 1615    Clinical Impression Statement  The patient has met 4 LTGs.  He has not met LTG for muscle length and continues with significant hamstring tightness bilaterally.  The patient's pain is reduced to 0/10 when using dynamic tape for patellar tracking.  Without tape, he has mild pain when transitioning from sitting to standing.  The patient has generalized weakness throughout.  PT has focused on the knee and provided education for general conditioning offering options (walking program, peloton app, or online yoga video for hamstrings).  Patient to f/u with Dr. Dianah Field for reassessment.    Clinical Decision Making  Low    Rehab Potential  Good    PT Frequency  2x / week    PT Duration  6 weeks    PT Treatment/Interventions  Patient/family education;ADLs/Self Care Home Management;Cryotherapy;Electrical Stimulation;Iontophoresis 47m/ml Dexamethasone;Moist Heat;Ultrasound;Gait training;Stair training;Therapeutic activities;Therapeutic exercise;Balance training;Neuromuscular re-education;Manual techniques;Dry needling;Taping    PT Next Visit Plan  On hold-- return to MD    PT Home Exercise Plan  [phone]   Consulted and Agree with Plan of Care  Patient       Patient will benefit from skilled therapeutic intervention in order to improve the following deficits and impairments:  Increased fascial restricitons, Decreased activity tolerance, Pain, Decreased mobility, Decreased strength, Postural dysfunction, Improper body mechanics  Visit Diagnosis: Acute pain of left knee  Other symptoms and signs involving the  musculoskeletal system    PHYSICAL THERAPY DISCHARGE SUMMARY  Visits from Start of Care: 8  Current functional level related to goals / functional  outcomes: See goals above   Remaining deficits: Patient was meeting PT goals.  Planned to continue working on there ex at home.   Education / Equipment: Home program.  Plan: Patient agrees to discharge.  Patient goals were met. Patient is being discharged due to meeting the stated rehab goals.  ?????         Thank you for the referral of this patient. Rudell Cobb, MPT   Beatty , PT 09/25/2019, 4:31 PM  [facility] [place], [place], [address] Phone: [phone]   Fax:  [phone]  Name: David Richards. MRN: [id number] Date of Birth: 2000-10-24

## 2019-09-27 ENCOUNTER — Other Ambulatory Visit: Payer: Self-pay

## 2019-09-27 ENCOUNTER — Ambulatory Visit (INDEPENDENT_AMBULATORY_CARE_PROVIDER_SITE_OTHER): Payer: BC Managed Care – PPO | Admitting: Sports Medicine

## 2019-09-27 ENCOUNTER — Encounter: Payer: Self-pay | Admitting: Sports Medicine

## 2019-09-27 DIAGNOSIS — M25862 Other specified joint disorders, left knee: Secondary | ICD-10-CM

## 2019-09-27 NOTE — Progress Notes (Signed)
    Procedures performed today:    None.  Independent interpretation of notes and tests performed by another provider:   None.  Brief History, Exam, Impression, and Recommendations:    Patellofemoral dysfunction, left Madeline returns, he is a pleasant 19 year old male with patellofemoral dysfunction. He has done 6 weeks of physical therapy, he is using kinesiotaping, and reports resolution of his symptoms. I have advised him to get more diligent with his rehab exercises which she does report doing intermittently now, he can return to see me on an as-needed basis. If recurrence of symptoms we would consider an injection.    ___________________________________________ [name]. Benjamin Stain, M.D., ABFM., CAQSM. Primary Care and Sports Medicine [facility]  Adjunct Instructor of Family Medicine  [employer]

## 2019-09-27 NOTE — Assessment & Plan Note (Addendum)
David Richards returns, he is a pleasant 19 year old male with patellofemoral dysfunction. He has done 6 weeks of physical therapy, he is using kinesiotaping, and reports resolution of his symptoms. I have advised him to get more diligent with his rehab exercises which she does report doing intermittently now, he can return to see me on an as-needed basis. If recurrence of symptoms we would consider an injection.

## 2019-10-17 ENCOUNTER — Ambulatory Visit (INDEPENDENT_AMBULATORY_CARE_PROVIDER_SITE_OTHER): Payer: BC Managed Care – PPO | Admitting: Family Medicine

## 2019-10-17 ENCOUNTER — Encounter: Payer: Self-pay | Admitting: Family Medicine

## 2019-10-17 VITALS — BP 127/63 | HR 75 | Ht 69.0 in | Wt 240.0 lb

## 2019-10-17 DIAGNOSIS — R748 Abnormal levels of other serum enzymes: Secondary | ICD-10-CM | POA: Diagnosis not present

## 2019-10-17 DIAGNOSIS — R635 Abnormal weight gain: Secondary | ICD-10-CM

## 2019-10-17 NOTE — Progress Notes (Signed)
Established Patient Office Visit  Subjective:  Patient ID: David Richards., male    DOB: 12/13/2000  Age: 19 y.o. MRN: [id number]  CC:  Chief Complaint  Patient presents with  . Weight Check    HPI Abdulmalik Darco. presents for weight loss.  Last visit we had discussed trying to work on getting 20 to 30 minutes of exercise at least 3 days a week.  He says he has started walking some.  He is not sure if he is completely up to 20 or 30 minutes but says he has been trying to work at getting a few days then.  We also discussed maybe trying to increase vegetables in his diet but says he really did not try any.  He really does not like vegetables.  Still working on cutting back on portions etc.  Past Medical History:  Diagnosis Date  . ADHD (attention deficit hyperactivity disorder)   . Asthma    exercise induced    No past surgical history on file.  Family History  Problem Relation Age of Onset  . Hypertension Father   . Obesity Father     Social History   Socioeconomic History  . Marital status: Single    Spouse name: Not on file  . Number of children: Not on file  . Years of education: Not on file  . Highest education level: Not on file  Occupational History  . Occupation: Consulting civil engineer  Tobacco Use  . Smoking status: Never Smoker  . Smokeless tobacco: Never Used  Substance and Sexual Activity  . Alcohol use: No  . Drug use: No  . Sexual activity: Not on file  Other Topics Concern  . Not on file  Social History Narrative   Not the best diet.    Social Determinants of Health   Financial Resource Strain:   . Difficulty of Paying Living Expenses:   Food Insecurity:   . Worried About Programme researcher, broadcasting/film/video in the Last Year:   . Barista in the Last Year:   Transportation Needs:   . Freight forwarder (Medical):   Marland Kitchen Lack of Transportation (Non-Medical):   Physical Activity:   . Days of Exercise per Week:   . Minutes of Exercise per Session:   Stress:    . Feeling of Stress :   Social Connections:   . Frequency of Communication with Friends and Family:   . Frequency of Social Gatherings with Friends and Family:   . Attends Religious Services:   . Active Member of Clubs or Organizations:   . Attends Banker Meetings:   Marland Kitchen Marital Status:   Intimate Partner Violence:   . Fear of Current or Ex-Partner:   . Emotionally Abused:   Marland Kitchen Physically Abused:   . Sexually Abused:     Outpatient Medications Prior to Visit  Medication Sig Dispense Refill  . meloxicam (MOBIC) 15 MG tablet One tab PO qAM with a meal for 2 weeks, then daily prn pain. 30 tablet 3  . SODIUM FLUORIDE 5000 SENSITIVE 1.1-5 % PSTE USE TO BRUSH TEETH TWICE A DAY *DO NOT RINSE, EAT DRINK AFER BRUSHING FOR 30 MINUTES*     No facility-administered medications prior to visit.    Allergies  Allergen Reactions  . Amoxicillin-Pot Clavulanate Other (See Comments)    Abdominal pain and cramping.   . Ampicillin Diarrhea    ROS Review of Systems    Objective:    Physical  Exam  Constitutional: He is oriented to person, place, and time. He appears well-developed and well-nourished.  HENT:  Head: Normocephalic and atraumatic.  Eyes: Conjunctivae and EOM are normal.  Cardiovascular: Normal rate.  Pulmonary/Chest: Effort normal.  Neurological: He is alert and oriented to person, place, and time.  Skin: Skin is dry. No pallor.  Psychiatric: He has a normal mood and affect. His behavior is normal.  Vitals reviewed.   BP 127/63   Pulse 75   Ht 5\' 9"  (1.753 m)   Wt 240 lb (108.9 kg)   SpO2 99%   BMI 35.44 kg/m  Wt Readings from Last 3 Encounters:  10/17/19 240 lb (108.9 kg)  09/27/19 244 lb (110.7 kg)  09/19/19 245 lb (111.1 kg)     There are no preventive care reminders to display for this patient.  There are no preventive care reminders to display for this patient.  Lab Results  Component Value Date   TSH 1.108 01/09/2014   Lab Results   Component Value Date   WBC 11.5 (H) 12/16/2018   HGB 15.1 12/16/2018   HCT 45.2 12/16/2018   MCV 85.6 12/16/2018   PLT 414 (H) 12/16/2018   Lab Results  Component Value Date   NA 139 08/21/2019   K 4.8 08/21/2019   CO2 29 08/21/2019   GLUCOSE 87 08/21/2019   BUN 14 08/21/2019   CREATININE 0.86 08/21/2019   BILITOT 0.5 08/21/2019   ALKPHOS 134 07/16/2014   AST 32 08/21/2019   ALT 66 (H) 08/21/2019   PROT 7.4 08/21/2019   ALBUMIN 5.0 07/16/2014   CALCIUM 10.2 08/21/2019   Lab Results  Component Value Date   CHOL 211 (H) 12/16/2018   Lab Results  Component Value Date   HDL 35 (L) 12/16/2018   Lab Results  Component Value Date   LDLCALC 158 (H) 12/16/2018   Lab Results  Component Value Date   TRIG 81 12/16/2018   Lab Results  Component Value Date   CHOLHDL 6.0 (H) 12/16/2018   Lab Results  Component Value Date   HGBA1C 5.3 07/16/2014      Assessment & Plan:   Problem List Items Addressed This Visit      Other   Elevated liver enzymes   Relevant Orders   Hepatic function panel   Abnormal weight gain - Primary    Current weight: 240 pounds Previous weight: 244 pounds Change in weight: Down 4 pounds Exercise goal: Continue to work on consistently getting 20 to 30 minutes of exercise and at least 3 days a week. Nutrition goal: Continue to work on portion control in addition to cutting back on carb intake did encourage him to at least try eating a couple of vegetables between when I see him back. Medications: None Follow-up: 4 weeks Current motivators: Father's health status.         Elevated liver - he wants to wait until next appt.  Order placed.      No orders of the defined types were placed in this encounter.   Follow-up: Return in about 4 weeks (around 11/14/2019) for Weight check.    Beatrice Lecher, MD

## 2019-10-17 NOTE — Assessment & Plan Note (Signed)
Current weight: 240 pounds Previous weight: 244 pounds Change in weight: Down 4 pounds Exercise goal: Continue to work on consistently getting 20 to 30 minutes of exercise and at least 3 days a week. Nutrition goal: Continue to work on portion control in addition to cutting back on carb intake did encourage him to at least try eating a couple of vegetables between when I see him back. Medications: None Follow-up: 4 weeks Current motivators: Father's health status.

## 2019-11-07 ENCOUNTER — Encounter: Payer: Self-pay | Admitting: Family Medicine

## 2019-11-11 LAB — HEPATIC FUNCTION PANEL
AG Ratio: 2.3 (calc) (ref 1.0–2.5)
ALT: 70 U/L — ABNORMAL HIGH (ref 9–46)
AST: 33 U/L (ref 10–40)
Albumin: 4.9 g/dL (ref 3.6–5.1)
Alkaline phosphatase (APISO): 86 U/L (ref 36–130)
Bilirubin, Direct: 0.1 mg/dL (ref 0.0–0.2)
Globulin: 2.1 g/dL (calc) (ref 1.9–3.7)
Indirect Bilirubin: 0.5 mg/dL (calc) (ref 0.2–1.2)
Total Bilirubin: 0.6 mg/dL (ref 0.2–1.2)
Total Protein: 7 g/dL (ref 6.1–8.1)

## 2019-11-14 NOTE — Addendum Note (Signed)
Addended by: Nani Gasser D on: 11/14/2019 09:19 AM   Modules accepted: Orders

## 2019-11-16 ENCOUNTER — Other Ambulatory Visit: Payer: Self-pay

## 2019-11-16 ENCOUNTER — Encounter: Payer: Self-pay | Admitting: Family Medicine

## 2019-11-16 ENCOUNTER — Ambulatory Visit (INDEPENDENT_AMBULATORY_CARE_PROVIDER_SITE_OTHER): Payer: BC Managed Care – PPO | Admitting: Family Medicine

## 2019-11-16 VITALS — BP 126/65 | HR 61 | Ht 69.0 in | Wt 244.0 lb

## 2019-11-16 DIAGNOSIS — R748 Abnormal levels of other serum enzymes: Secondary | ICD-10-CM | POA: Diagnosis not present

## 2019-11-16 DIAGNOSIS — H9201 Otalgia, right ear: Secondary | ICD-10-CM

## 2019-11-16 DIAGNOSIS — R635 Abnormal weight gain: Secondary | ICD-10-CM | POA: Diagnosis not present

## 2019-11-16 NOTE — Patient Instructions (Signed)
Recommend recheck liver in 3 months.

## 2019-11-16 NOTE — Progress Notes (Signed)
Established Patient Office Visit  Subjective:  Patient ID: David Burnley., male    DOB: [birth year]  Age: 19 y.o. MRN: [id number]  CC:  Chief Complaint  Patient presents with  . Weight Check    HPI David Crumble. presents for aloe up abnormal weight gain. We have been following up monthly for quite a while to set goals. Unfortunately since his last office visit he is actually up 4 pounds he actually lost 4 pounds when I saw him last time. We recently rechecked his liver enzymes and they really did not improve from last year. He does not take Tylenol he does not use alcohol. He is not on any type of supplements. His ALT is 70 and that is what it was almost a year ago.  Had a little bit of right ear pain on Monday but says it has resolved but he does want me to check it. He has not noticed any drainage or hearing loss. No popping or clicking over the TMJ joint  Past Medical History:  Diagnosis Date  . ADHD (attention deficit hyperactivity disorder)   . Asthma    exercise induced    No past surgical history on file.  Family History  Problem Relation Age of Onset  . Hypertension Father   . Obesity Father     Social History   Socioeconomic History  . Marital status: Single    Spouse name: Not on file  . Number of children: Not on file  . Years of education: Not on file  . Highest education level: Not on file  Occupational History  . Occupation: Consulting civil engineer  Tobacco Use  . Smoking status: Never Smoker  . Smokeless tobacco: Never Used  Vaping Use  . Vaping Use: Every day  . Substances: Nicotine  Substance and Sexual Activity  . Alcohol use: No  . Drug use: No  . Sexual activity: Not on file  Other Topics Concern  . Not on file  Social History Narrative   Not the best diet.    Social Determinants of Health   Financial Resource Strain:   . Difficulty of Paying Living Expenses:   Food Insecurity:   . Worried About Programme researcher, broadcasting/film/video in the Last Year:   . Occupational psychologist in the Last Year:   Transportation Needs:   . Freight forwarder (Medical):   Marland Kitchen Lack of Transportation (Non-Medical):   Physical Activity:   . Days of Exercise per Week:   . Minutes of Exercise per Session:   Stress:   . Feeling of Stress :   Social Connections:   . Frequency of Communication with Friends and Family:   . Frequency of Social Gatherings with Friends and Family:   . Attends Religious Services:   . Active Member of Clubs or Organizations:   . Attends Banker Meetings:   Marland Kitchen Marital Status:   Intimate Partner Violence:   . Fear of Current or Ex-Partner:   . Emotionally Abused:   Marland Kitchen Physically Abused:   . Sexually Abused:     Outpatient Medications Prior to Visit  Medication Sig Dispense Refill  . meloxicam (MOBIC) 15 MG tablet One tab PO qAM with a meal for 2 weeks, then daily prn pain. 30 tablet 3  . SODIUM FLUORIDE 5000 SENSITIVE 1.1-5 % PSTE USE TO BRUSH TEETH TWICE A DAY *DO NOT RINSE, EAT DRINK AFER BRUSHING FOR 30 MINUTES*     No facility-administered medications  prior to visit.    Allergies  Allergen Reactions  . Amoxicillin-Pot Clavulanate Other (See Comments)    Abdominal pain and cramping.   . Ampicillin Diarrhea    ROS Review of Systems    Objective:    Physical Exam Vitals reviewed.  Constitutional:      Appearance: He is well-developed.  HENT:     Head: Normocephalic and atraumatic.     Ears:     Comments: TM canals are clear bilaterally. Eyes:     Conjunctiva/sclera: Conjunctivae normal.  Cardiovascular:     Rate and Rhythm: Normal rate.  Pulmonary:     Effort: Pulmonary effort is normal.  Skin:    General: Skin is dry.     Coloration: Skin is not pale.  Neurological:     Mental Status: He is alert and oriented to person, place, and time.  Psychiatric:        Behavior: Behavior normal.     BP 126/65   Pulse 61   Ht 5\' 9"  (1.753 m)   Wt 244 lb (110.7 kg)   SpO2 100%   BMI 36.03 kg/m  Wt  Readings from Last 3 Encounters:  11/16/19 244 lb (110.7 kg)  10/17/19 240 lb (108.9 kg)  09/27/19 244 lb (110.7 kg)     There are no preventive care reminders to display for this patient.  There are no preventive care reminders to display for this patient.  Lab Results  Component Value Date   TSH 1.108 01/09/2014   Lab Results  Component Value Date   WBC 11.5 (H) 12/16/2018   HGB 15.1 12/16/2018   HCT 45.2 12/16/2018   MCV 85.6 12/16/2018   PLT 414 (H) 12/16/2018   Lab Results  Component Value Date   NA 139 08/21/2019   K 4.8 08/21/2019   CO2 29 08/21/2019   GLUCOSE 87 08/21/2019   BUN 14 08/21/2019   CREATININE 0.86 08/21/2019   BILITOT 0.6 11/10/2019   ALKPHOS 134 07/16/2014   AST 33 11/10/2019   ALT 70 (H) 11/10/2019   PROT 7.0 11/10/2019   ALBUMIN 5.0 07/16/2014   CALCIUM 10.2 08/21/2019   Lab Results  Component Value Date   CHOL 211 (H) 12/16/2018   Lab Results  Component Value Date   HDL 35 (L) 12/16/2018   Lab Results  Component Value Date   LDLCALC 158 (H) 12/16/2018   Lab Results  Component Value Date   TRIG 81 12/16/2018   Lab Results  Component Value Date   CHOLHDL 6.0 (H) 12/16/2018   Lab Results  Component Value Date   HGBA1C 5.3 07/16/2014      Assessment & Plan:   Problem List Items Addressed This Visit      Other   Elevated liver enzymes   Relevant Orders   Amb Ref to Medical Weight Management   Abnormal weight gain - Primary   Relevant Orders   Amb Ref to Medical Weight Management    Other Visit Diagnoses    Right ear pain         Abnormal weight gain-discussed options. We can continue to work on weight monthly. We could certainly consider prescription options. Is not interested in that at this time. We also discussed getting him in with a more comprehensive bariatric program he is open to doing that and would like a referral placed. His BMI is currently 36. It really like to work on just getting more healthy in  general and getting his liver  enzymes back down to normal.  Right ear pain-normal exam today. Pain has resolved.  No orders of the defined types were placed in this encounter.   Follow-up: Return if symptoms worsen or fail to improve.   Time spent 22 minutes in encounter.  Nani Gasser, MD

## 2020-03-25 ENCOUNTER — Encounter: Payer: BC Managed Care – PPO | Admitting: Family Medicine

## 2020-03-26 ENCOUNTER — Ambulatory Visit (INDEPENDENT_AMBULATORY_CARE_PROVIDER_SITE_OTHER): Payer: BC Managed Care – PPO | Admitting: Sports Medicine

## 2020-03-26 DIAGNOSIS — M79671 Pain in right foot: Secondary | ICD-10-CM | POA: Diagnosis not present

## 2020-03-26 DIAGNOSIS — M25862 Other specified joint disorders, left knee: Secondary | ICD-10-CM

## 2020-03-26 NOTE — Assessment & Plan Note (Addendum)
David Richards has also had right foot pain now for a month, he works in a warehouse on concrete floors, he has started to have some pain along his fifth metatarsal shaft, midshaft and distally. He was seen by Dr. [facility], x-rays were negative but it sounds like an ultrasound showed potentially some increased power Doppler flow in the periosteum suspicious for a stress fracture. He was appropriately placed in a boot, he has noted improvement in his symptoms. He was seen at a [facility] facility, had another x-ray that was also negative. He would like another couple of weeks of light duty in the boot and I think this is appropriate. I did add an additional lateral posting in his custom orthotic to help unload his fifth metatarsal. Return to see me in a month for this. If persistence of symptoms we will proceed with MRI.

## 2020-03-26 NOTE — Progress Notes (Signed)
    Procedures performed today:    None.  Independent interpretation of notes and tests performed by another provider:   None.  Brief History, Exam, Impression, and Recommendations:    Patellofemoral dysfunction, left Zavior is having worsening pain in his left knee, we did 6 weeks of physical therapy, kinesiotaping, he had initial resolution of his symptoms but unfortunately they have returned. He can continue over-the-counter analgesics, I am going to add a reaction knee brace. I think some of this may be due to his altered gait due to his current right foot injury as well. Follow-up with me in a month for this, we can consider injection if no better.  Right foot pain Saulo has also had right foot pain now for a month, he works in a warehouse on concrete floors, he has started to have some pain along his fifth metatarsal shaft, midshaft and distally. He was seen by Dr. [facility], x-rays were negative but it sounds like an ultrasound showed potentially some increased power Doppler flow in the periosteum suspicious for a stress fracture. He was appropriately placed in a boot, he has noted improvement in his symptoms. He was seen at a [facility] facility, had another x-ray that was also negative. He would like another couple of weeks of light duty in the boot and I think this is appropriate. I did add an additional lateral posting in his custom orthotic to help unload his fifth metatarsal. Return to see me in a month for this. If persistence of symptoms we will proceed with MRI.    ___________________________________________ [name]. Benjamin Stain, M.D., ABFM., CAQSM. Primary Care and Sports Medicine [facility]  Adjunct Instructor of Family Medicine  [employer]

## 2020-03-26 NOTE — Assessment & Plan Note (Signed)
David Richards is having worsening pain in his left knee, we did 6 weeks of physical therapy, kinesiotaping, he had initial resolution of his symptoms but unfortunately they have returned. He can continue over-the-counter analgesics, I am going to add a reaction knee brace. I think some of this may be due to his altered gait due to his current right foot injury as well. Follow-up with me in a month for this, we can consider injection if no better.

## 2020-04-23 ENCOUNTER — Ambulatory Visit (INDEPENDENT_AMBULATORY_CARE_PROVIDER_SITE_OTHER): Payer: BC Managed Care – PPO | Admitting: Sports Medicine

## 2020-04-23 ENCOUNTER — Other Ambulatory Visit: Payer: Self-pay

## 2020-04-23 DIAGNOSIS — M79671 Pain in right foot: Secondary | ICD-10-CM

## 2020-04-23 DIAGNOSIS — M25862 Other specified joint disorders, left knee: Secondary | ICD-10-CM | POA: Diagnosis not present

## 2020-04-23 NOTE — Progress Notes (Signed)
    Procedures performed today:    None.  Independent interpretation of notes and tests performed by another provider:   None.  Brief History, Exam, Impression, and Recommendations:    Patellofemoral dysfunction, left Tammy Sours has improved considerably with regards to his left patellofemoral syndrome, he has discontinued his boot, he does not need the reaction knee brace as often, return as needed.  Right foot pain Potential fifth metatarsal stress injury, improved with lateral posting on his custom orthotics, return to see me as needed.    ___________________________________________ [name]. Benjamin Stain, M.D., ABFM., CAQSM. Primary Care and Sports Medicine [facility] [facility]  Adjunct Instructor of Family Medicine  [employer]

## 2020-04-23 NOTE — Assessment & Plan Note (Signed)
David Richards has improved considerably with regards to his left patellofemoral syndrome, he has discontinued his boot, he does not need the reaction knee brace as often, return as needed.

## 2020-04-23 NOTE — Assessment & Plan Note (Signed)
Potential fifth metatarsal stress injury, improved with lateral posting on his custom orthotics, return to see me as needed.

## 2020-05-08 ENCOUNTER — Ambulatory Visit: Payer: BC Managed Care – PPO | Admitting: Sports Medicine

## 2021-05-26 IMAGING — DX DG KNEE COMPLETE 4+V*L*
4 series · 4 of 4 positions shown · non-contrast
Comparison: None.

CLINICAL DATA: Medial left knee pain.  No known injury.

EXAM:
LEFT KNEE - COMPLETE 4+ VIEW

[knee ap]
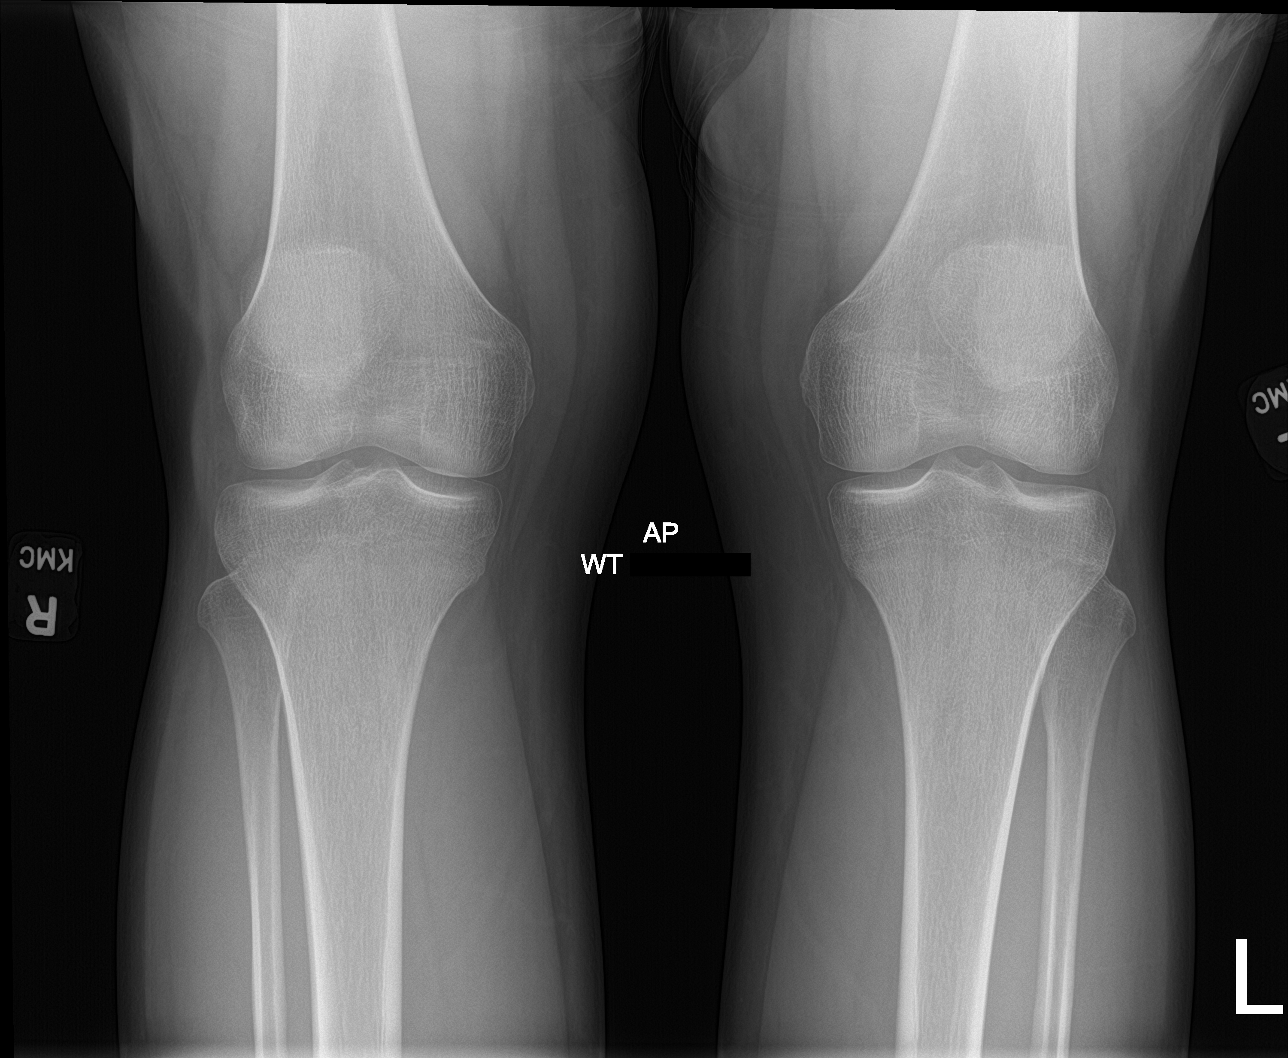

[tunnel]
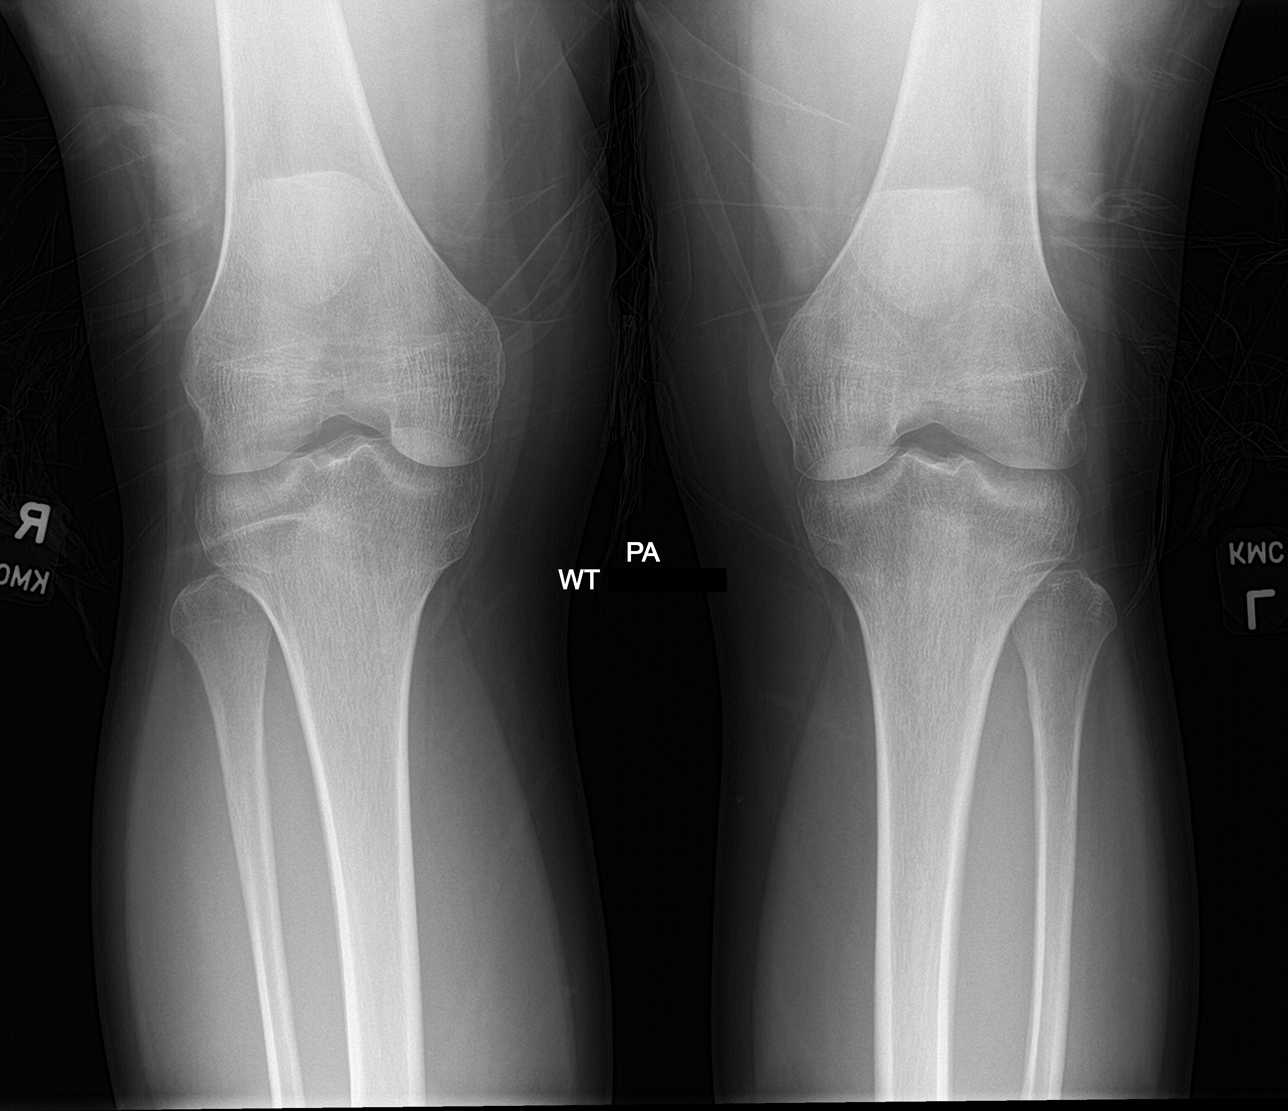

[knee lat]
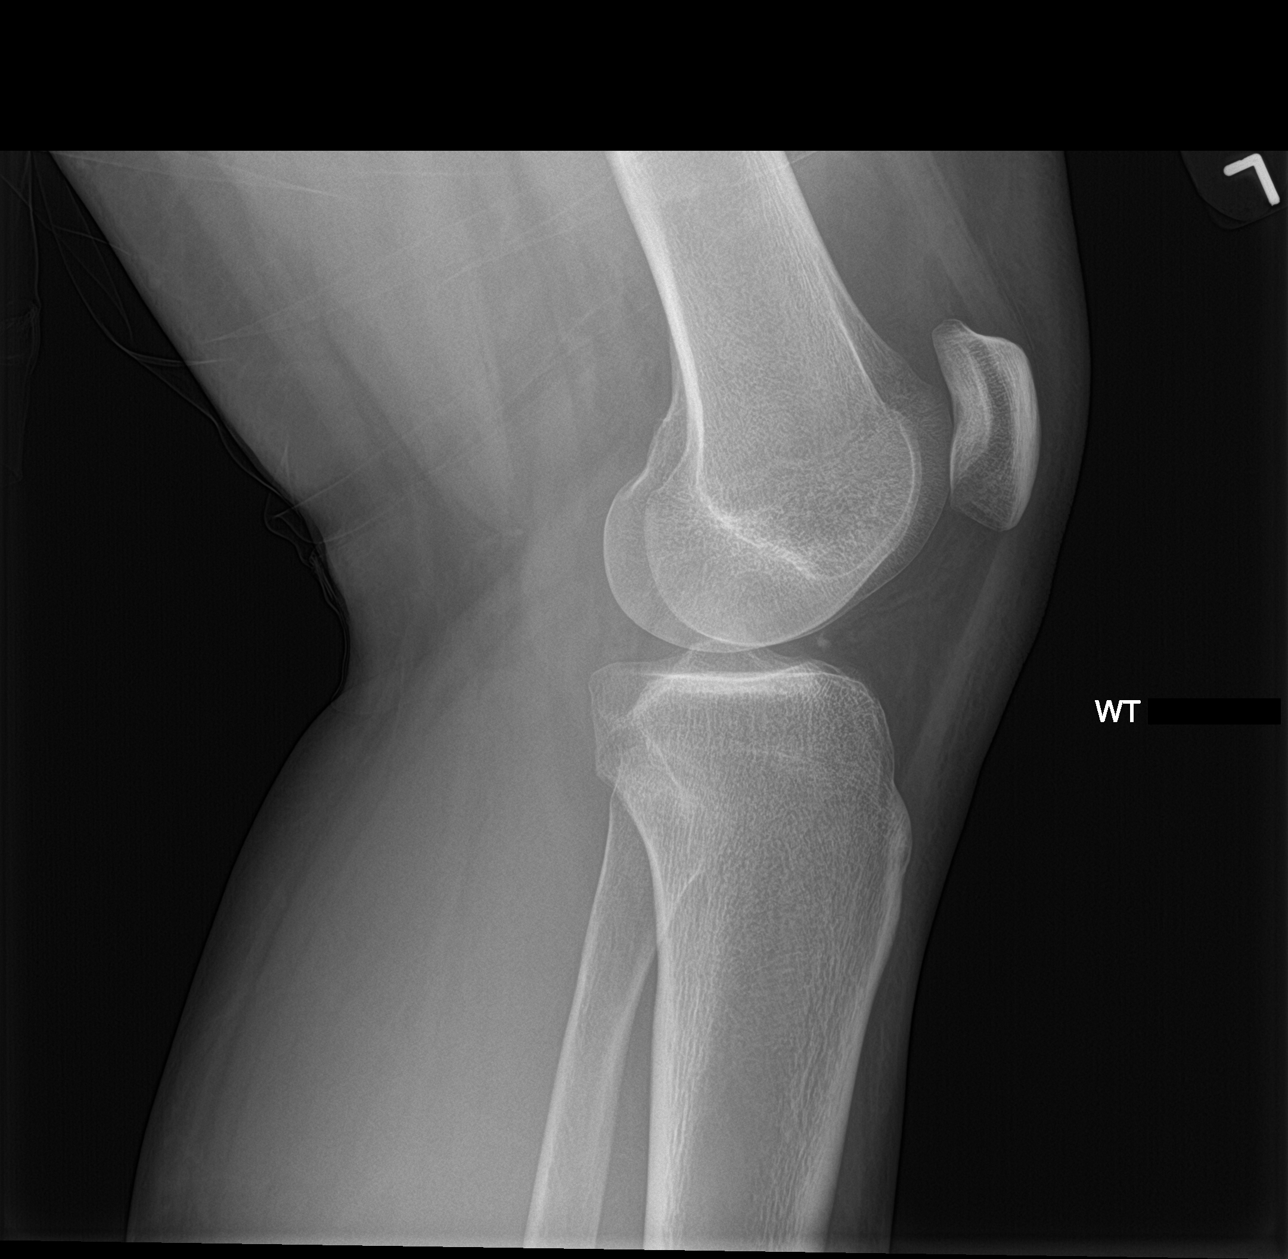

[knee sunrise]
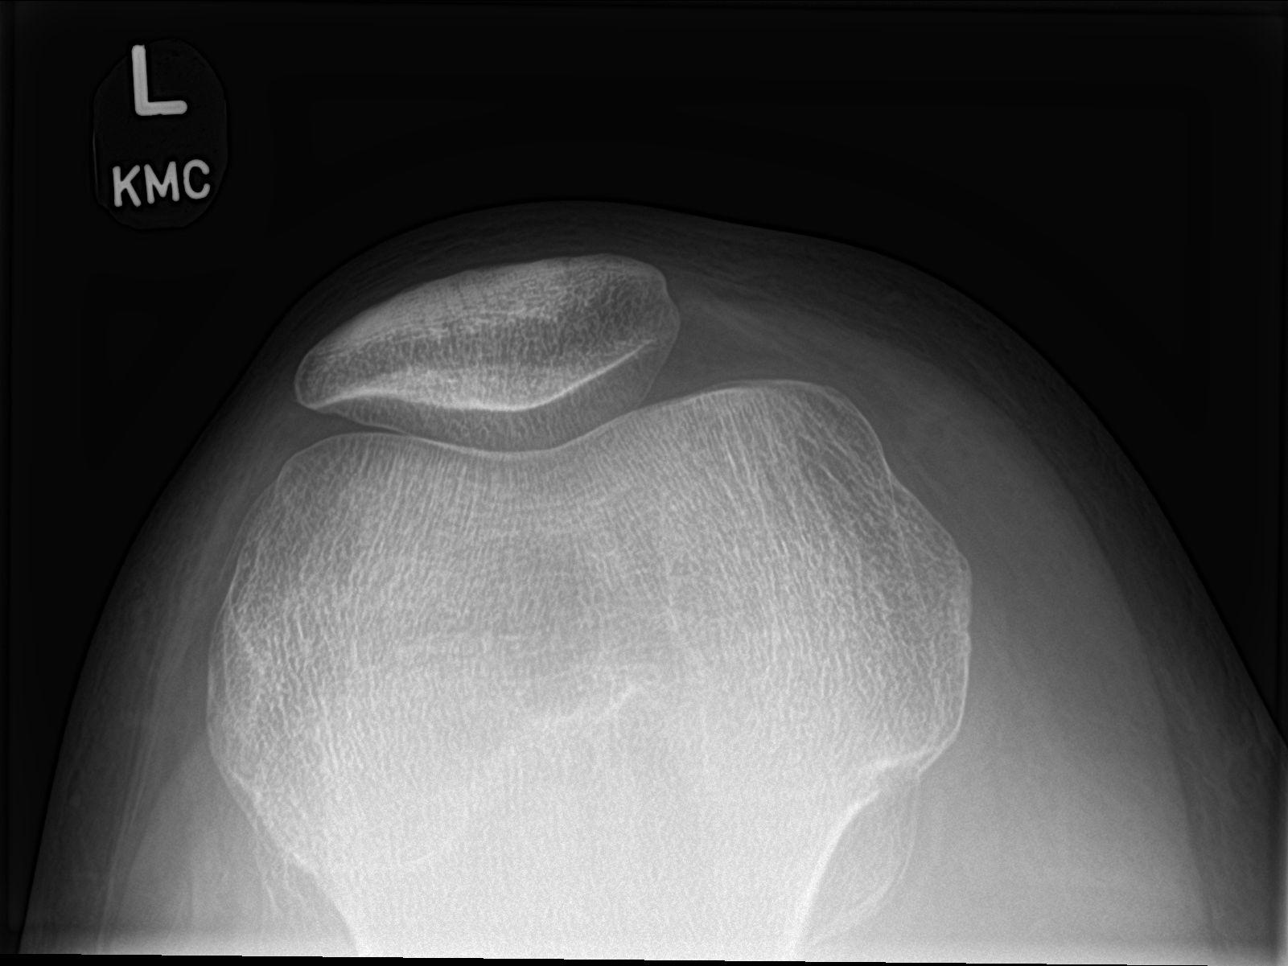

[4 of 4 positions shown; findings below may reference images not displayed]

FINDINGS: No evidence of fracture, dislocation, or joint effusion. No evidence
of arthropathy or other focal bone abnormality. Soft tissues are
unremarkable.
IMPRESSION: Negative.

## 2021-06-14 ENCOUNTER — Encounter: Payer: Self-pay | Admitting: Family Medicine

## 2021-06-14 DIAGNOSIS — Z8349 Family history of other endocrine, nutritional and metabolic diseases: Secondary | ICD-10-CM

## 2021-06-14 DIAGNOSIS — R748 Abnormal levels of other serum enzymes: Secondary | ICD-10-CM

## 2021-06-14 DIAGNOSIS — Z8639 Personal history of other endocrine, nutritional and metabolic disease: Secondary | ICD-10-CM

## 2021-06-14 DIAGNOSIS — Z833 Family history of diabetes mellitus: Secondary | ICD-10-CM

## 2021-06-14 DIAGNOSIS — R7989 Other specified abnormal findings of blood chemistry: Secondary | ICD-10-CM

## 2021-06-14 DIAGNOSIS — Z0000 Encounter for general adult medical examination without abnormal findings: Secondary | ICD-10-CM

## 2021-06-16 NOTE — Telephone Encounter (Signed)
Labs pended, please advise if anything needs added.

## 2021-06-16 NOTE — Telephone Encounter (Signed)
Orders Placed This Encounter  Procedures   TSH   Lipid Panel w/reflex Direct LDL   COMPLETE METABOLIC PANEL WITH GFR   CBC with Differential/Platelet

## 2021-06-19 ENCOUNTER — Other Ambulatory Visit: Payer: Self-pay

## 2021-06-19 ENCOUNTER — Encounter: Payer: Self-pay | Admitting: Family Medicine

## 2021-06-19 ENCOUNTER — Ambulatory Visit (INDEPENDENT_AMBULATORY_CARE_PROVIDER_SITE_OTHER): Payer: 59 | Admitting: Family Medicine

## 2021-06-19 VITALS — BP 130/77 | HR 74 | Resp 16 | Ht 69.0 in | Wt 259.0 lb

## 2021-06-19 DIAGNOSIS — Z0000 Encounter for general adult medical examination without abnormal findings: Secondary | ICD-10-CM

## 2021-06-19 DIAGNOSIS — Z6838 Body mass index (BMI) 38.0-38.9, adult: Secondary | ICD-10-CM

## 2021-06-19 DIAGNOSIS — Z7289 Other problems related to lifestyle: Secondary | ICD-10-CM

## 2021-06-19 LAB — CBC
HCT: 44.8 % (ref 38.5–50.0)
Hemoglobin: 15.2 g/dL (ref 13.2–17.1)
MCH: 29.1 pg (ref 27.0–33.0)
MCHC: 33.9 g/dL (ref 32.0–36.0)
MCV: 85.8 fL (ref 80.0–100.0)
MPV: 10.9 fL (ref 7.5–12.5)
Platelets: 429 10*3/uL — ABNORMAL HIGH (ref 140–400)
RBC: 5.22 10*6/uL (ref 4.20–5.80)
RDW: 12.6 % (ref 11.0–15.0)
WBC: 8.4 10*3/uL (ref 3.8–10.8)

## 2021-06-19 LAB — LIPID PANEL W/REFLEX DIRECT LDL
Cholesterol: 182 mg/dL (ref <200)
HDL: 30 mg/dL — ABNORMAL LOW (ref >=40)
LDL Cholesterol (Calc): 130 mg/dL (calc) — ABNORMAL HIGH
Non-HDL Cholesterol (Calc): 152 mg/dL (calc) — ABNORMAL HIGH (ref <130)
Total CHOL/HDL Ratio: 6.1 (calc) — ABNORMAL HIGH (ref <5.0)
Triglycerides: 111 mg/dL (ref <150)

## 2021-06-19 LAB — COMPLETE METABOLIC PANEL WITH GFR
AG Ratio: 2 (calc) (ref 1.0–2.5)
ALT: 82 U/L — ABNORMAL HIGH (ref 9–46)
AST: 45 U/L — ABNORMAL HIGH (ref 10–40)
Albumin: 4.6 g/dL (ref 3.6–5.1)
Alkaline phosphatase (APISO): 73 U/L (ref 36–130)
BUN: 11 mg/dL (ref 7–25)
CO2: 29 mmol/L (ref 20–32)
Calcium: 9.6 mg/dL (ref 8.6–10.3)
Chloride: 104 mmol/L (ref 98–110)
Creat: 0.92 mg/dL (ref 0.60–1.24)
Globulin: 2.3 g/dL (calc) (ref 1.9–3.7)
Glucose, Bld: 86 mg/dL (ref 65–99)
Potassium: 4.2 mmol/L (ref 3.5–5.3)
Sodium: 139 mmol/L (ref 135–146)
Total Bilirubin: 1.1 mg/dL (ref 0.2–1.2)
Total Protein: 6.9 g/dL (ref 6.1–8.1)
eGFR: 121 mL/min/{1.73_m2} (ref >=60)

## 2021-06-19 LAB — TSH: TSH: 1.56 mIU/L (ref 0.40–4.50)

## 2021-06-19 NOTE — Progress Notes (Addendum)
Established Patient Office Visit  Subjective:  Patient ID: David Tay., male    DOB: [birth year]  Age: 21 y.o. MRN: [id number]  CC:  Chief Complaint  Patient presents with   Annual Exam    Fasting     HPI David Bedwell. presents for CPE.  He reports that overall he is doing well.  He is working full-time at [employer].  He is now working second shift so he says his sleep is fair usually gets about 6 hours of sleep and feels like he needs a little bit more.  He just has a hard time settling down after he gets off of work.  He says he maybe has alcohol every couple weeks nothing to excess.  He does vape daily.  He feels like overall his mood is good.  He is active at his job as he works in [employer] but does not have a time.  For discrete exercise.  He states he is not currently sexually active.  He would like to get blood work drawn today.  Past Medical History:  Diagnosis Date   ADHD (attention deficit hyperactivity disorder)    Asthma    exercise induced    History reviewed. No pertinent surgical history.  Family History  Problem Relation Age of Onset   Healthy Mother    Hypertension Father    Obesity Father    Diabetes Father     Social History   Socioeconomic History   Marital status: Single    Spouse name: Not on file   Number of children: 0   Years of education: Not on file   Highest education level: Not on file  Occupational History   Occupation: Consulting civil engineer   Occupation: [employer]  Tobacco Use   Smoking status: Never   Smokeless tobacco: Never  Vaping Use   Vaping Use: Every day   Substances: Nicotine  Substance and Sexual Activity   Alcohol use: No   Drug use: No   Sexual activity: Not on file  Other Topics Concern   Not on file  Social History Narrative   No exercise. 1 soda a day.    Social Determinants of Corporate investment banker Strain: Not on file  Food Insecurity: No Food Insecurity   Worried About Brewing technologist in the Last Year: Never true   Ran Out of Food in the Last Year: Never true  Transportation Needs: Not on file  Physical Activity: Not on file  Stress: Not on file  Social Connections: Not on file  Intimate Partner Violence: Not on file    No outpatient medications prior to visit.   No facility-administered medications prior to visit.    Allergies  Allergen Reactions   Amoxicillin-Pot Clavulanate Other (See Comments)    Abdominal pain and cramping.    Ampicillin Diarrhea    ROS Review of Systems    Objective:    Physical Exam Constitutional:      Appearance: He is well-developed.  HENT:     Head: Normocephalic and atraumatic.     Right Ear: External ear normal.     Left Ear: External ear normal.     Nose: Nose normal.  Eyes:     Conjunctiva/sclera: Conjunctivae normal.     Pupils: Pupils are equal, round, and reactive to light.  Neck:     Thyroid: No thyromegaly.  Cardiovascular:     Rate and Rhythm: Normal rate and regular rhythm.  Heart sounds: Normal heart sounds.  Pulmonary:     Effort: Pulmonary effort is normal.     Breath sounds: Normal breath sounds.  Abdominal:     General: Bowel sounds are normal. There is no distension.     Palpations: Abdomen is soft. There is no mass.     Tenderness: There is no abdominal tenderness. There is no guarding or rebound.  Musculoskeletal:        General: Normal range of motion.     Cervical back: Normal range of motion and neck supple.  Lymphadenopathy:     Cervical: No cervical adenopathy.  Skin:    General: Skin is warm and dry.  Neurological:     Mental Status: He is alert and oriented to person, place, and time.     Deep Tendon Reflexes: Reflexes are normal and symmetric.  Psychiatric:        Behavior: Behavior normal.        Thought Content: Thought content normal.        Judgment: Judgment normal.    BP 130/77 (BP Location: Left Arm)    Pulse 74    Resp 16    Ht 5\' 9"  (1.753 m)    Wt 259 lb  (117.5 kg)    SpO2 98%    BMI 38.25 kg/m  Wt Readings from Last 3 Encounters:  06/19/21 259 lb (117.5 kg)  11/16/19 244 lb (110.7 kg)  10/17/19 240 lb (108.9 kg)     There are no preventive care reminders to display for this patient.  There are no preventive care reminders to display for this patient.  Lab Results  Component Value Date   TSH 1.108 01/09/2014   Lab Results  Component Value Date   WBC 11.5 (H) 12/16/2018   HGB 15.1 12/16/2018   HCT 45.2 12/16/2018   MCV 85.6 12/16/2018   PLT 414 (H) 12/16/2018   Lab Results  Component Value Date   NA 139 08/21/2019   K 4.8 08/21/2019   CO2 29 08/21/2019   GLUCOSE 87 08/21/2019   BUN 14 08/21/2019   CREATININE 0.86 08/21/2019   BILITOT 0.6 11/10/2019   ALKPHOS 134 07/16/2014   AST 33 11/10/2019   ALT 70 (H) 11/10/2019   PROT 7.0 11/10/2019   ALBUMIN 5.0 07/16/2014   CALCIUM 10.2 08/21/2019   Lab Results  Component Value Date   CHOL 211 (H) 12/16/2018   Lab Results  Component Value Date   HDL 35 (L) 12/16/2018   Lab Results  Component Value Date   LDLCALC 158 (H) 12/16/2018   Lab Results  Component Value Date   TRIG 81 12/16/2018   Lab Results  Component Value Date   CHOLHDL 6.0 (H) 12/16/2018   Lab Results  Component Value Date   HGBA1C 5.3 07/16/2014      Assessment & Plan:   Problem List Items Addressed This Visit   None Visit Diagnoses     Wellness examination    -  Primary   Relevant Orders   Lipid Panel w/reflex Direct LDL   COMPLETE METABOLIC PANEL WITH GFR   CBC   TSH   Engages in vaping          Keep up a regular exercise program and make sure you are eating a healthy diet Try to eat 4 servings of dairy a day, or if you are lactose intolerant take a calcium with vitamin D daily.  Your vaccines are up to date. Due for Tdap  this summer Encouraged him to quit vaping.   Will get up to date labs today.    BMI 38-we discussed options he is interested in weight loss.  We will  refer him to a more comprehensive program with [facility] since [place].  I think it could be helpful for him long-term  Vaping-encourage cessation   No orders of the defined types were placed in this encounter.   Follow-up: No follow-ups on file.    Nani Gasseratherine Tylon Kemmerling, MD

## 2021-06-20 ENCOUNTER — Other Ambulatory Visit: Payer: Self-pay

## 2021-06-20 DIAGNOSIS — R748 Abnormal levels of other serum enzymes: Secondary | ICD-10-CM

## 2021-06-20 NOTE — Addendum Note (Signed)
Addended by: Nani Gasser D on: 06/20/2021 10:43 AM   Modules accepted: Orders

## 2021-06-20 NOTE — Progress Notes (Signed)
Hi David Richards, your LDL is still elevated on your cholesterol but it does look better compared to 2 years ago.  So great work and bringing that down just continue to work on [employer] and regular exercise.  Your liver enzymes are elevated.  I know you said you do not drink a lot of alcohol but just really encourage you to maybe cut back a little bit and then lets recheck your liver function again in about 3 to 4 weeks.  Your platelets are elevated but have been for the last 10 years so they are actually pretty stable.  Your thyroid looks great.

## 2021-12-16 ENCOUNTER — Telehealth: Payer: Self-pay | Admitting: General Practice

## 2021-12-16 NOTE — Telephone Encounter (Signed)
Transition Care Management Unsuccessful Follow-up Telephone Call  Date of discharge and from where:  12/14/21 from [facility]  Attempts:  1st Attempt  Reason for unsuccessful TCM follow-up call:  Left voice message

## 2021-12-17 NOTE — Telephone Encounter (Signed)
Transition Care Management Unsuccessful Follow-up Telephone Call  Date of discharge and from where:  12/14/21 from [facility]  Attempts:  2nd Attempt  Reason for unsuccessful TCM follow-up call:  Left voice message    

## 2021-12-23 ENCOUNTER — Encounter: Payer: Self-pay | Admitting: Sports Medicine

## 2021-12-23 ENCOUNTER — Ambulatory Visit: Payer: 59 | Admitting: Sports Medicine

## 2021-12-23 DIAGNOSIS — M79671 Pain in right foot: Secondary | ICD-10-CM | POA: Diagnosis not present

## 2021-12-23 DIAGNOSIS — T7840XA Allergy, unspecified, initial encounter: Secondary | ICD-10-CM

## 2021-12-23 DIAGNOSIS — H02823 Cysts of right eye, unspecified eyelid: Secondary | ICD-10-CM | POA: Diagnosis not present

## 2021-12-23 MED ORDER — EPINEPHRINE 0.3 MG/0.3ML IJ SOAJ: 0.3000 mg | INTRAMUSCULAR | 1 refills | Status: DC | PRN

## 2021-12-23 NOTE — Assessment & Plan Note (Signed)
Pleasant and previously healthy 21 year old male, recently had an episode where he had to go to the ER after eating a brownie from a health food store, the brownie had the typical ingredients, sugar, gluten-free flour, it also had flaxseed oil, cocoa. No nuts. Immediately after he developed nausea, vomiting, a rash over his body, the sensation of his throat closing, he did not have any actual lip swelling/angioedema, and no wheezing. He was seen in the ED, given steroids and discharged. I have advised avoidance, we will give him an EpiPen with anticipatory guidance on presenting to the emergency department after use. He should call us if this happens again.

## 2021-12-23 NOTE — Assessment & Plan Note (Signed)
Left upper eyelid, removed with pressure, return as needed for

## 2021-12-23 NOTE — Assessment & Plan Note (Signed)
We made Tammy Sours some custom molded orthotics years ago, he would like another set, referral to Dr. Jordan Likes.

## 2021-12-23 NOTE — Telephone Encounter (Signed)
Transition Care Management Follow-up Telephone Call Date of discharge and from where: 12/14/21 from [facility] How have you been since you were released from the hospital? Patient has an OV scheduled with PCP this morning. Any questions or concerns? No

## 2021-12-23 NOTE — Progress Notes (Signed)
    Procedures performed today:    Procedure: Removal of 0.3 cm right upper eyelid sebaceous cyst Risks, benefits, and alternatives explained and consent obtained. Time out conducted. Surface prepped with alcohol. No topical anesthesia needed I used a 25-gauge needle to make a small skin incision and then used pressure to express the contents of the epidermoid/sebaceous cyst on the right upper eyelid. Hemostasis achieved. Pt stable.  Independent interpretation of notes and tests performed by another provider:   None.  Brief History, Exam, Impression, and Recommendations:    Allergic reaction Pleasant and previously healthy 21 year old male, recently had an episode where he had to go to the ER after eating a brownie from a health food store, the brownie had the typical ingredients, sugar, gluten-free flour, it also had flaxseed oil, cocoa. No nuts. Immediately after he developed nausea, vomiting, a rash over his body, the sensation of his throat closing, he did not have any actual lip swelling/angioedema, and no wheezing. He was seen in the ED, given steroids and discharged. I have advised avoidance, we will give him an EpiPen with anticipatory guidance on presenting to the emergency department after use. He should call us if this happens again.  Right foot pain We made [name] some custom molded orthotics years ago, he would like another set, referral to Dr. Jordan Likes.  Epidermoid cyst of eyelid, right Left upper eyelid, removed with pressure, return as needed for    ____________________________________________ [name]. Benjamin Stain, M.D., ABFM., CAQSM., AME. Primary Care and Sports Medicine [facility]  Adjunct Professor of Family Medicine  [place] [employer] [facility] [employer]  Restaurant manager, fast food

## 2022-01-05 ENCOUNTER — Encounter: Payer: Self-pay | Admitting: Family Medicine

## 2022-01-05 ENCOUNTER — Ambulatory Visit (INDEPENDENT_AMBULATORY_CARE_PROVIDER_SITE_OTHER): Payer: 59 | Admitting: Family Medicine

## 2022-01-05 DIAGNOSIS — M79671 Pain in right foot: Secondary | ICD-10-CM | POA: Diagnosis not present

## 2022-01-05 NOTE — Assessment & Plan Note (Signed)
Acute on chronic in nature.  Has component of metatarsalgia with a fairly cavus foot. -Counseled on home exercise therapy and supportive care. -Orthotics -Could consider metatarsal pads

## 2022-01-05 NOTE — Progress Notes (Signed)
  David Richards. - 21 y.o. male MRN [id number]  Date of birth: 2001/02/28  SUBJECTIVE:  Including CC & ROS.  No chief complaint on file.   David Richards. is a 20 y.o. male that is presenting with acute on chronic foot pain.  He has a history of metatarsalgia.  Tends to walk and work on his feet for long periods of time.   Review of Systems See HPI   HISTORY: Past Medical, Surgical, Social, and Family History Reviewed & Updated per EMR.   Pertinent Historical Findings include:  Past Medical History:  Diagnosis Date   ADHD (attention deficit hyperactivity disorder)    Asthma    exercise induced    History reviewed. No pertinent surgical history.   PHYSICAL EXAM:  VS: Ht 5\' 9"  (1.753 m)   Wt 251 lb (113.9 kg)   BMI 37.07 kg/m  Physical Exam Gen: NAD, alert, cooperative with exam, well-appearing MSK:  Neurovascularly intact    Patient was fitted for a standard, cushioned, semi-rigid orthotic. The orthotic was heated and afterward the patient stood on the orthotic blank positioned on the orthotic stand. The patient was positioned in subtalar neutral position and 10 degrees of ankle dorsiflexion in a weight bearing stance. After completion of molding, a stable base was applied to the orthotic blank. The blank was ground to a stable position for weight bearing. Size: 12 Pairs: 2 Base: Blue EVA Additional Posting and Padding: None The patient ambulated these, and they were very comfortable.    ASSESSMENT & PLAN:   Right foot pain Acute on chronic in nature.  Has component of metatarsalgia with a fairly cavus foot. -Counseled on home exercise therapy and supportive care. -Orthotics -Could consider metatarsal pads

## 2022-02-18 ENCOUNTER — Ambulatory Visit (INDEPENDENT_AMBULATORY_CARE_PROVIDER_SITE_OTHER): Payer: 59

## 2022-02-18 ENCOUNTER — Ambulatory Visit: Payer: 59 | Admitting: Sports Medicine

## 2022-02-18 DIAGNOSIS — M79671 Pain in right foot: Secondary | ICD-10-CM

## 2022-02-18 NOTE — Assessment & Plan Note (Signed)
David Richards returns, he is a pleasant 21 year old male, we treated him sometime ago for a potential fifth metatarsal stress injury, more recently a week ago he took a misstep and inverted his right foot, he has pain along the distal shaft of the fifth metatarsal. I would like some x-rays today, he will wear a boot that he already has at home for the next couple of weeks. He does work standing up on a forklift, and is unable to wear a boot and cannot do any light duty so he will do a lace up ankle brace in a regular shoe while at work. If we do find a fracture of course we will have to take him out of work. Tentatively plan to see me back in 2 to 3 weeks.

## 2022-02-18 NOTE — Progress Notes (Signed)
    Procedures performed today:    None.  Independent interpretation of notes and tests performed by another provider:   None.  Brief History, Exam, Impression, and Recommendations:    Right foot pain David Richards returns, he is a pleasant 21 year old male, we treated him sometime ago for a potential fifth metatarsal stress injury, more recently a week ago he took a misstep and inverted his right foot, he has pain along the distal shaft of the fifth metatarsal. I would like some x-rays today, he will wear a boot that he already has at home for the next couple of weeks. He does work standing up on a forklift, and is unable to wear a boot and cannot do any light duty so he will do a lace up ankle brace in a regular shoe while at work. If we do find a fracture of course we will have to take him out of work. Tentatively plan to see me back in 2 to 3 weeks.    ____________________________________________ Gwen Her. Dianah Field, M.D., ABFM., CAQSM., AME. Primary Care and Sports Medicine [facility]  Adjunct Professor of [place] [employer] [facility] [employer] Medicine  Risk manager

## 2022-03-11 ENCOUNTER — Ambulatory Visit: Payer: 59 | Admitting: Sports Medicine

## 2022-08-28 ENCOUNTER — Telehealth: Payer: Self-pay | Admitting: Family Medicine

## 2022-08-28 DIAGNOSIS — Z833 Family history of diabetes mellitus: Secondary | ICD-10-CM

## 2022-08-28 DIAGNOSIS — Z8639 Personal history of other endocrine, nutritional and metabolic disease: Secondary | ICD-10-CM

## 2022-08-28 DIAGNOSIS — Z8349 Family history of other endocrine, nutritional and metabolic diseases: Secondary | ICD-10-CM

## 2022-08-28 DIAGNOSIS — E782 Mixed hyperlipidemia: Secondary | ICD-10-CM

## 2022-08-28 DIAGNOSIS — Z0000 Encounter for general adult medical examination without abnormal findings: Secondary | ICD-10-CM

## 2022-08-28 NOTE — Telephone Encounter (Signed)
Annual labs ordered. Pending provider's review.

## 2022-08-28 NOTE — Telephone Encounter (Signed)
Appt  for a physical scheduled for May 2nd and patient is requesting bloodwork prior.

## 2022-09-01 NOTE — Telephone Encounter (Signed)
Orders Placed This Encounter  Procedures   TSH   CBC   Comprehensive Metabolic Panel (CMET)   Lipid Panel With LDL/HDL Ratio   HgB A1c

## 2022-09-01 NOTE — Telephone Encounter (Signed)
Message sent to patient via Mychart.

## 2022-09-07 ENCOUNTER — Encounter: Payer: Self-pay | Admitting: *Deleted

## 2022-09-12 LAB — LIPID PANEL WITH LDL/HDL RATIO
Cholesterol, Total: 203 mg/dL — ABNORMAL HIGH (ref 100–199)
HDL: 34 mg/dL — ABNORMAL LOW (ref >39)
LDL Chol Calc (NIH): 150 mg/dL — ABNORMAL HIGH (ref 0–99)
LDL/HDL Ratio: 4.4 ratio — ABNORMAL HIGH (ref 0.0–3.6)
Triglycerides: 102 mg/dL (ref 0–149)
VLDL Cholesterol Cal: 19 mg/dL (ref 5–40)

## 2022-09-12 LAB — CBC
Hematocrit: 46.4 % (ref 37.5–51.0)
Hemoglobin: 15.3 g/dL (ref 13.0–17.7)
MCH: 28.9 pg (ref 26.6–33.0)
MCHC: 33 g/dL (ref 31.5–35.7)
MCV: 88 fL (ref 79–97)
Platelets: 403 10*3/uL (ref 150–450)
RBC: 5.3 x10E6/uL (ref 4.14–5.80)
RDW: 12.6 % (ref 11.6–15.4)
WBC: 7.6 10*3/uL (ref 3.4–10.8)

## 2022-09-12 LAB — TSH: TSH: 1.83 u[IU]/mL (ref 0.450–4.500)

## 2022-09-12 LAB — HEMOGLOBIN A1C
Est. average glucose Bld gHb Est-mCnc: 108 mg/dL
Hgb A1c MFr Bld: 5.4 % (ref 4.8–5.6)

## 2022-09-15 NOTE — Progress Notes (Signed)
Hi David Richards, your LDL cholesterol is elevated. Just really encourage you to work on your diet and exercise.  Your thyroid is OK. your blood count is a good.  A1c is normal no sign of diabetes or prediabetes.

## 2022-09-23 NOTE — Progress Notes (Unsigned)
   Complete physical exam  Patient: David Richards.   DOB: 08/28/2000   22 y.o. Male  MRN: [id number]  Subjective:    No chief complaint on file.   David Richards. is a 22 y.o. male who presents today for a complete physical exam. He reports consuming a {diet types:17450} diet. {types:19826} He generally feels {DESC; WELL/FAIRLY WELL/POORLY:18703}. He reports sleeping {DESC; WELL/FAIRLY WELL/POORLY:18703}. He {does/does not:200015} have additional problems to discuss today.    Most recent fall risk assessment:    06/19/2021   11:03 AM  Fall Risk   Falls in the past year? 0  Number falls in past yr: 0  Injury with Fall? 0  Risk for fall due to : No Fall Risks  Follow up Falls evaluation completed;Falls prevention discussed     Most recent depression screenings:    06/19/2021   11:04 AM 12/16/2018    3:15 PM  PHQ 2/9 Scores  PHQ - 2 Score 0 0    {VISON DENTAL STD PSA (Optional):27386}  {History (Optional):23778}  Patient Care Team: Agapito Games, MD as PCP - General (Family Medicine)   Outpatient Medications Prior to Visit  Medication Sig   EPINEPHrine 0.3 mg/0.3 mL IJ SOAJ injection Inject 0.3 mg into the muscle as needed for anaphylaxis.   No facility-administered medications prior to visit.    ROS        Objective:     There were no vitals taken for this visit. {Vitals History (Optional):23777}  Physical Exam   No results found for any visits on 09/24/22. {Show previous labs (optional):23779}    Assessment & Plan:    Routine Health Maintenance and Physical Exam  Immunization History  Administered Date(s) Administered   H1N1 04/04/2008   Meningococcal Conjugate 12/28/2012   PFIZER(Purple Top)SARS-COV-2 Vaccination 07/26/2019, 08/16/2019   Tdap 12/23/2011    Health Maintenance  Topic Date Due   HPV VACCINES (1 - Male 2-dose series) Never done   DTaP/Tdap/Td (2 - Td or Tdap) 12/22/2021   COVID-19 Vaccine (3 - 2023-24 season)  01/23/2022   HIV Screening  09/18/2028 (Originally 07/24/2013)   INFLUENZA VACCINE  12/24/2022   Hepatitis C Screening  Completed   Pneumococcal Vaccine 30-64 Years old  Aged Out    Discussed health benefits of physical activity, and encouraged him to engage in regular exercise appropriate for his age and condition.  Problem List Items Addressed This Visit   None Visit Diagnoses     Well adult exam    -  Primary      No follow-ups on file.     David Gasser, MD

## 2022-09-24 ENCOUNTER — Encounter: Payer: Self-pay | Admitting: Family Medicine

## 2022-09-24 ENCOUNTER — Ambulatory Visit (INDEPENDENT_AMBULATORY_CARE_PROVIDER_SITE_OTHER): Payer: 59 | Admitting: Family Medicine

## 2022-09-24 VITALS — BP 120/70 | HR 73 | Ht 69.0 in | Wt 238.0 lb

## 2022-09-24 DIAGNOSIS — L720 Epidermal cyst: Secondary | ICD-10-CM | POA: Diagnosis not present

## 2022-09-24 DIAGNOSIS — L918 Other hypertrophic disorders of the skin: Secondary | ICD-10-CM

## 2022-09-24 DIAGNOSIS — L858 Other specified epidermal thickening: Secondary | ICD-10-CM

## 2022-09-24 DIAGNOSIS — Z0000 Encounter for general adult medical examination without abnormal findings: Secondary | ICD-10-CM

## 2022-10-23 ENCOUNTER — Encounter: Payer: Self-pay | Admitting: Family Medicine

## 2022-10-23 ENCOUNTER — Ambulatory Visit: Payer: 59 | Admitting: Family Medicine

## 2022-10-23 VITALS — BP 123/67 | HR 66 | Ht 69.0 in | Wt 235.0 lb

## 2022-10-23 DIAGNOSIS — L918 Other hypertrophic disorders of the skin: Secondary | ICD-10-CM | POA: Diagnosis not present

## 2022-10-23 NOTE — Progress Notes (Signed)
   Established Patient Office Visit  Subjective   Patient ID: David Richards., male    DOB: 17-Jul-2000  Age: 22 y.o. MRN: [id number]  Chief Complaint  Patient presents with   skin tag removal         HPI Here for Skin tag removal.  He has several skin tags under both axilla and a few around his neck.      ROS    Objective:     BP 123/67   Pulse 66   Ht 5\' 9"  (1.753 m)   Wt 235 lb (106.6 kg)   SpO2 96%   BMI 34.70 kg/m     Physical Exam   No results found for any visits on 10/23/22.     The ASCVD Risk score (Arnett DK, et al., 2019) failed to calculate for the following reasons:   The 2019 ASCVD risk score is only valid for ages 51 to 74    Assessment & Plan:   Problem List Items Addressed This Visit   None Visit Diagnoses     Skin tag    -  Primary     Skin Tag Removal Procedure Note Diagnosis: normal skin tags Location: axillae and neck  Informed Consent: Discussed risks (permanent scarring, infection, pain, bleeding, bruising, redness, and recurrence of the lesion) and benefits of the procedure, as well as the alternatives. He is aware that skin tags are benign lesions, and their removal is often not considered medically necessary. Informed consent was obtained. Preparation: The area was prepared in a standard fashion. Anesthesia: not required Procedure Details: Iris scissors were used to perform sharp removal. Aluminum chloride was applied for hemostasis. Ointment and bandage were applied where needed. The patient tolerated the procedure well. Total number of lesions treated: 15 Plan: The patient was instructed on post-op care. Recommend OTC analgesia as needed for pain.   No follow-ups on file.    Nani Gasser, MD

## 2023-03-01 ENCOUNTER — Ambulatory Visit: Payer: 59 | Admitting: Family Medicine

## 2023-03-01 ENCOUNTER — Encounter: Payer: Self-pay | Admitting: Family Medicine

## 2023-03-01 VITALS — BP 116/60 | HR 45 | Ht 69.0 in | Wt 209.0 lb

## 2023-03-01 DIAGNOSIS — K5900 Constipation, unspecified: Secondary | ICD-10-CM | POA: Diagnosis not present

## 2023-03-01 DIAGNOSIS — R109 Unspecified abdominal pain: Secondary | ICD-10-CM

## 2023-03-01 NOTE — Progress Notes (Signed)
Acute Office Visit  Subjective:     Patient ID: David Trudo., male    DOB: 02-13-2001, 22 y.o.   MRN: [id number]  Chief Complaint  Patient presents with   Abdominal Pain    X2 weeks mostly L sided. 4/10 some gas and bloating. Last BM was yesterday and this was normal. Denies any f/s/c/n/v/d    HPI Patient is in today for Left sided abd pain on and off for 2 weeks.  He has noticed over the course of time that his bowels are moving as consistently he goes every day sometimes is now every other day he does not feel like he is straining but he did have 1 day where he noticed a little bright red blood after wiping.  No nausea vomiting or diarrhea.  No heartburn symptoms.  The abdominal pain and cramping is not associated with eating or not eating.  He also just feels more bloated in general and feels like there is some tightness in his abdomen.  He denies any recent dietary changes he actually eats very healthy he eats a pretty high-fiber diet and he works at [employer] he feels like he does a great job on his water intake.  ROS      Objective:    BP 116/60   Pulse (!) 45   Ht 5\' 9"  (1.753 m)   Wt 209 lb (94.8 kg)   SpO2 100%   BMI 30.86 kg/m    Physical Exam Vitals and nursing note reviewed.  Constitutional:      Appearance: Normal appearance.  HENT:     Head: Normocephalic and atraumatic.  Eyes:     Conjunctiva/sclera: Conjunctivae normal.  Cardiovascular:     Rate and Rhythm: Normal rate and regular rhythm.  Pulmonary:     Effort: Pulmonary effort is normal.     Breath sounds: Normal breath sounds.  Abdominal:     General: Bowel sounds are normal. There is no distension.     Palpations: Abdomen is soft.     Tenderness: There is no abdominal tenderness.  Skin:    General: Skin is warm and dry.  Neurological:     Mental Status: He is alert.  Psychiatric:        Mood and Affect: Mood normal.     No results found for any visits on 03/01/23.      Assessment  & Plan:   Problem List Items Addressed This Visit   None Visit Diagnoses     Left sided abdominal pain    -  Primary   Relevant Orders   CMP14+EGFR   Lipase   CBC with Differential/Platelet   TSH   Constipation, unspecified constipation type       Relevant Orders   CMP14+EGFR   Lipase   CBC with Differential/Platelet   TSH       Left sided abd pain-I think most consistent with decreased bowel movements.  He is not necessarily straining but I really do think he is having some mild constipation and that is leading to some increased bloating and gas.  We discussed using a stool softener to get bowels moving a little bit more consistently he can even use a laxative occasionally if needed.  He does a great job with a high-fiber diet and drinking plenty of water.  Will check some labs today just to make sure that everything is normal including kidney liver, pancreas and thyroid.  No orders of the defined types were  placed in this encounter.   Return if symptoms worsen or fail to improve.  Nani Gasser, MD

## 2023-03-01 NOTE — Patient Instructions (Signed)
Recommend starting a stool softener like Colace daily with a meal.  You can increase to twice a day if needed to keep stools more consistent.  If you need to take a laxative over the weekend to get things moving you can do that as well.  MiraLAX is a good safe gentle laxative if you need to use something.  If you feel like this is not helping then please let me know.

## 2023-03-02 LAB — CBC WITH DIFFERENTIAL/PLATELET
Basophils Absolute: 0.1 10*3/uL (ref 0.0–0.2)
Basos: 1 %
EOS (ABSOLUTE): 0.4 10*3/uL (ref 0.0–0.4)
Eos: 5 %
Hematocrit: 42.4 % (ref 37.5–51.0)
Hemoglobin: 14.2 g/dL (ref 13.0–17.7)
Immature Grans (Abs): 0 10*3/uL (ref 0.0–0.1)
Immature Granulocytes: 0 %
Lymphocytes Absolute: 3.4 10*3/uL — ABNORMAL HIGH (ref 0.7–3.1)
Lymphs: 41 %
MCH: 29.8 pg (ref 26.6–33.0)
MCHC: 33.5 g/dL (ref 31.5–35.7)
MCV: 89 fL (ref 79–97)
Monocytes Absolute: 0.6 10*3/uL (ref 0.1–0.9)
Monocytes: 8 %
Neutrophils Absolute: 3.8 10*3/uL (ref 1.4–7.0)
Neutrophils: 45 %
Platelets: 321 10*3/uL (ref 150–450)
RBC: 4.77 x10E6/uL (ref 4.14–5.80)
RDW: 12.8 % (ref 11.6–15.4)
WBC: 8.2 10*3/uL (ref 3.4–10.8)

## 2023-03-02 LAB — CMP14+EGFR
ALT: 33 [IU]/L (ref 0–44)
AST: 19 [IU]/L (ref 0–40)
Albumin: 4.5 g/dL (ref 4.3–5.2)
Alkaline Phosphatase: 103 [IU]/L (ref 44–121)
BUN/Creatinine Ratio: 14 (ref 9–20)
BUN: 13 mg/dL (ref 6–20)
Bilirubin Total: 0.4 mg/dL (ref 0.0–1.2)
CO2: 22 mmol/L (ref 20–29)
Calcium: 9.7 mg/dL (ref 8.7–10.2)
Chloride: 104 mmol/L (ref 96–106)
Creatinine, Ser: 0.91 mg/dL (ref 0.76–1.27)
Globulin, Total: 2.2 g/dL (ref 1.5–4.5)
Glucose: 81 mg/dL (ref 70–99)
Potassium: 4.4 mmol/L (ref 3.5–5.2)
Sodium: 140 mmol/L (ref 134–144)
Total Protein: 6.7 g/dL (ref 6.0–8.5)
eGFR: 121 mL/min/{1.73_m2} (ref >59)

## 2023-03-02 LAB — TSH: TSH: 2.15 u[IU]/mL (ref 0.450–4.500)

## 2023-03-02 LAB — LIPASE: Lipase: 38 U/L (ref 13–78)

## 2023-03-02 NOTE — Progress Notes (Signed)
HI David Richards, your hemoglobin is normal.  Metabolic panel looks great.  Liver function is back down to normal which is fantastic!  No sign of pancreatitis which is great and your thyroid is normal.  So I think just working on getting your bowels moving a little bit more consistently and seeing if that takes care of the symptoms you are currently experiencing.

## 2023-04-04 ENCOUNTER — Encounter: Payer: Self-pay | Admitting: Emergency Medicine

## 2023-04-04 ENCOUNTER — Ambulatory Visit
Admission: EM | Admit: 2023-04-04 | Discharge: 2023-04-04 | Disposition: A | Discharge status: Home / Self Care | Payer: 59 | Attending: Family Medicine | Admitting: Family Medicine

## 2023-04-04 ENCOUNTER — Other Ambulatory Visit: Payer: Self-pay

## 2023-04-04 DIAGNOSIS — J069 Acute upper respiratory infection, unspecified: Secondary | ICD-10-CM | POA: Diagnosis not present

## 2023-04-04 LAB — POC SARS CORONAVIRUS 2 AG -  ED: SARS Coronavirus 2 Ag: NEGATIVE

## 2023-04-04 LAB — POCT INFLUENZA A/B
Influenza A, POC: NEGATIVE
Influenza B, POC: NEGATIVE

## 2023-04-04 MED ORDER — IBUPROFEN 800 MG PO TABS: 800.0000 mg | ORAL_TABLET | Freq: Three times a day (TID) | ORAL | 0 refills | Status: DC

## 2023-04-04 MED ORDER — BENZONATATE 200 MG PO CAPS
200.0000 mg | ORAL_CAPSULE | Freq: Three times a day (TID) | ORAL | 0 refills | Status: DC | PRN
Start: 2023-04-04 — End: 2023-07-05

## 2023-04-04 NOTE — Discharge Instructions (Addendum)
Make sure you are drinking lots of fluids Take ibuprofen 3 times a day with food.  This will help with fever and bodyaches Take Tessalon 2-3 times a day for cough See your doctor if not improving by next week

## 2023-04-04 NOTE — ED Triage Notes (Signed)
Patient c/o sore throat, cough, body aches, headache, fatigue x 3 days.  Patient has taken [employer]ater[employer].

## 2023-04-04 NOTE — ED Provider Notes (Signed)
[facility]    CSN: [id number] Arrival date & time: 04/04/23  0854      History   Chief Complaint Chief Complaint  Patient presents with   Cough    HPI David Richards. is a 22 y.o. male.   HPI  Patient has had headache, body aches, cough and fatigue for the last 3 days.  No known exposure to illness.  No asthma, wheezing, shortness of breath.  Mother accompanies him.  She does much of the talking.  He has sore throat..  No sinus congestion  Past Medical History:  Diagnosis Date   ADHD (attention deficit hyperactivity disorder)    Asthma    exercise induced    Patient Active Problem List   Diagnosis Date Noted   Allergic reaction 12/23/2021   Epidermoid cyst of eyelid, right 12/23/2021   Right foot pain 03/26/2020   Abnormal weight gain 08/21/2019   BMI 36.0-36.9,adult 08/21/2019   Patellofemoral dysfunction, left 08/16/2019   Tobacco use 01/09/2019   Elevated liver enzymes 01/09/2019   Elevated platelet count 01/09/2019   Lumbar degenerative disc disease 04/08/2017   Lumbar radiculopathy 11/13/2016   Dyslexia 03/08/2016   Low back pain with left-sided sciatica 10/01/2014   History of high cholesterol 10/01/2014   Family history of thyroid disease 10/01/2014   Family history of diabetes mellitus in father 10/01/2014   Immunization not carried out because of parent refusal 10/01/2014   Congenital pes cavus 02/01/2012   Unilateral weakness 03/19/2011   Obesity 01/12/2011   ADHD (attention deficit hyperactivity disorder) 12/25/2010   Hyperlipidemia 12/25/2010   Exercise-induced asthma 12/25/2010    History reviewed. No pertinent surgical history.     Home Medications    Prior to Admission medications   Medication Sig Start Date End Date Taking? Authorizing Provider  benzonatate (TESSALON) 200 MG capsule Take 1 capsule (200 mg total) by mouth 3 (three) times daily as needed for cough. 04/04/23  Yes Eustace Moore, MD  EPINEPHrine 0.3  mg/0.3 mL IJ SOAJ injection Inject 0.3 mg into the muscle as needed for anaphylaxis. 12/23/21  Yes Monica Becton, MD  ibuprofen (ADVIL) 800 MG tablet Take 1 tablet (800 mg total) by mouth 3 (three) times daily. 04/04/23  Yes Eustace Moore, MD    Family History Family History  Problem Relation Age of Onset   Healthy Mother    Hypertension Father    Obesity Father    Diabetes Father     Social History Social History   Tobacco Use   Smoking status: Never   Smokeless tobacco: Never  Vaping Use   Vaping status: Former   Substances: Nicotine  Substance Use Topics   Alcohol use: No   Drug use: No     Allergies   Amoxicillin-pot clavulanate and Ampicillin   Review of Systems Review of Systems See HPI  Physical Exam Triage Vital Signs ED Triage Vitals  Encounter Vitals Group     BP 04/04/23 0903 124/81     Systolic BP Percentile --      Diastolic BP Percentile --      Pulse Rate 04/04/23 0903 87     Resp 04/04/23 0903 16     Temp 04/04/23 0903 98.9 F (37.2 C)     Temp Source 04/04/23 0903 Oral     SpO2 04/04/23 0903 96 %     Weight 04/04/23 0904 200 lb (90.7 kg)     Height 04/04/23 0904 5\' 11"  (1.803 m)  Head Circumference --      Peak Flow --      Pain Score 04/04/23 0904 5     Pain Loc --      Pain Education --      Exclude from Growth Chart --    No data found.  Updated Vital Signs BP 124/81 (BP Location: Left Arm)   Pulse 87   Temp 98.9 F (37.2 C) (Oral)   Resp 16   Ht 5\' 11"  (1.803 m)   Wt 90.7 kg   SpO2 96%   BMI 27.89 kg/m      Physical Exam Constitutional:      General: He is not in acute distress.    Appearance: He is well-developed. He is ill-appearing.     Comments: Quiet personality  HENT:     Head: Normocephalic and atraumatic.     Right Ear: Tympanic membrane normal.     Left Ear: Tympanic membrane and ear canal normal.     Nose: Nose normal. No congestion.     Mouth/Throat:     Mouth: Mucous membranes are  moist.     Pharynx: Posterior oropharyngeal erythema present.  Eyes:     Conjunctiva/sclera: Conjunctivae normal.     Pupils: Pupils are equal, round, and reactive to light.  Cardiovascular:     Rate and Rhythm: Normal rate and regular rhythm.     Heart sounds: Normal heart sounds.  Pulmonary:     Effort: Pulmonary effort is normal. No respiratory distress.     Breath sounds: Normal breath sounds. No wheezing or rhonchi.  Abdominal:     General: There is no distension.     Palpations: Abdomen is soft.  Musculoskeletal:        General: Normal range of motion.     Cervical back: Normal range of motion. No rigidity.  Lymphadenopathy:     Cervical: No cervical adenopathy.  Skin:    General: Skin is warm and dry.  Neurological:     Mental Status: He is alert.     Gait: Gait normal.      UC Treatments / Results  Labs (all labs ordered are listed, but only abnormal results are displayed) Labs Reviewed  POCT INFLUENZA A/B  POC SARS CORONAVIRUS 2 AG -  ED    EKG   Radiology No results found.  Procedures Procedures (including critical care time)  Medications Ordered in UC Medications - No data to display  Initial Impression / Assessment and Plan / UC Course  I have reviewed the triage vital signs and the nursing notes.  Pertinent labs & imaging results that were available during my care of the patient were reviewed by me and considered in my medical decision making (see chart for details).     COVID and flu testing are negative Final Clinical Impressions(s) / UC Diagnoses   Final diagnoses:  Viral URI with cough     Discharge Instructions      Make sure you are drinking lots of fluids Take ibuprofen 3 times a day with food.  This will help with fever and bodyaches Take Tessalon 2-3 times a day for cough See your doctor if not improving by next week   ED Prescriptions     Medication Sig Dispense Auth. Provider   ibuprofen (ADVIL) 800 MG tablet Take 1  tablet (800 mg total) by mouth 3 (three) times daily. 21 tablet Eustace Moore, MD   benzonatate (TESSALON) 200 MG capsule Take 1 capsule (200  mg total) by mouth 3 (three) times daily as needed for cough. 21 capsule Eustace Moore, MD      PDMP not reviewed this encounter.   Eustace Moore, MD 04/04/23 [phone]

## 2023-04-05 ENCOUNTER — Encounter: Payer: Self-pay | Admitting: Sports Medicine

## 2023-04-05 ENCOUNTER — Ambulatory Visit: Payer: 59

## 2023-04-05 ENCOUNTER — Ambulatory Visit: Payer: 59 | Admitting: Sports Medicine

## 2023-04-05 DIAGNOSIS — Z0189 Encounter for other specified special examinations: Secondary | ICD-10-CM | POA: Diagnosis not present

## 2023-04-05 DIAGNOSIS — J069 Acute upper respiratory infection, unspecified: Secondary | ICD-10-CM

## 2023-04-05 DIAGNOSIS — M79671 Pain in right foot: Secondary | ICD-10-CM

## 2023-04-05 DIAGNOSIS — M222X1 Patellofemoral disorders, right knee: Secondary | ICD-10-CM

## 2023-04-05 MED ORDER — HYDROCOD POLI-CHLORPHE POLI ER 10-8 MG/5ML PO SUER: 5.0000 mL | Freq: Two times a day (BID) | ORAL | 0 refills | Status: DC | PRN

## 2023-04-05 MED ORDER — MELOXICAM 15 MG PO TABS: ORAL_TABLET | ORAL | 3 refills | Status: DC

## 2023-04-05 NOTE — Assessment & Plan Note (Signed)
Cough, upper respiratory symptoms, was seen in urgent care yesterday and swabbed negative for COVID. Was given [employer] which are not helping, dominant complaint is nocturnal cough, adding Tussionex. Out of work until next Monday. He understands we will expect about 2 weeks of symptoms.

## 2023-04-05 NOTE — Progress Notes (Signed)
    Procedures performed today:    None.  Independent interpretation of notes and tests performed by another provider:   None.  Brief History, Exam, Impression, and Recommendations:    Patellofemoral syndrome of right knee History of left patellofemoral syndrome, this responded to conservative treatment. Now right sided discomfort retropatellar worse going up and down stairs and squatting. He has been doing a lot of working out in [employer] He is lost a great deal of weight. On exam he has painful patellar compression as well as tenderness at the patellar facets as well as pain posterior/medial tibial plateau. Suspect patellofemoral syndrome, adding meloxicam, x-rays, home PT, we will transition to formal PT if he finds that he is slacking off.   Right foot pain Did well with custom orthotics, needs a new set.  Viral URI with cough Cough, upper respiratory symptoms, was seen in urgent care yesterday and swabbed negative for COVID. Was given [employer] which are not helping, dominant complaint is nocturnal cough, adding Tussionex. Out of work until next Monday. He understands we will expect about 2 weeks of symptoms.    ____________________________________________ [name]. Benjamin Stain, M.D., ABFM., CAQSM., AME. Primary Care and Sports Medicine [facility]  Adjunct Professor of Family Medicine  [employer]  Restaurant manager, fast food

## 2023-04-05 NOTE — Assessment & Plan Note (Signed)
History of left patellofemoral syndrome, this responded to conservative treatment. Now right sided discomfort retropatellar worse going up and down stairs and squatting. He has been doing a lot of working out in [employer] He is lost a great deal of weight. On exam he has painful patellar compression as well as tenderness at the patellar facets as well as pain posterior/medial tibial plateau. Suspect patellofemoral syndrome, adding meloxicam, x-rays, home PT, we will transition to formal PT if he finds that he is slacking off.

## 2023-04-05 NOTE — Assessment & Plan Note (Signed)
Did well with custom orthotics, needs a new set.

## 2023-04-06 ENCOUNTER — Encounter: Payer: Self-pay | Admitting: Sports Medicine

## 2023-04-06 DIAGNOSIS — M79671 Pain in right foot: Secondary | ICD-10-CM

## 2023-04-08 ENCOUNTER — Ambulatory Visit: Payer: 59 | Admitting: Sports Medicine

## 2023-04-09 ENCOUNTER — Encounter: Payer: 59 | Admitting: Family Medicine

## 2023-04-09 ENCOUNTER — Encounter: Payer: Self-pay | Admitting: Sports Medicine

## 2023-05-17 ENCOUNTER — Ambulatory Visit: Payer: 59 | Admitting: Sports Medicine

## 2023-06-20 ENCOUNTER — Other Ambulatory Visit: Payer: Self-pay | Admitting: Sports Medicine

## 2023-06-20 DIAGNOSIS — T7840XA Allergy, unspecified, initial encounter: Secondary | ICD-10-CM

## 2023-07-05 ENCOUNTER — Ambulatory Visit: Payer: 59 | Admitting: Family Medicine

## 2023-07-05 ENCOUNTER — Telehealth (INDEPENDENT_AMBULATORY_CARE_PROVIDER_SITE_OTHER): Payer: 59 | Admitting: Family Medicine

## 2023-07-05 ENCOUNTER — Encounter: Payer: Self-pay | Admitting: Family Medicine

## 2023-07-05 VITALS — Temp 98.0°F

## 2023-07-05 DIAGNOSIS — U071 COVID-19: Secondary | ICD-10-CM

## 2023-07-05 MED ORDER — NIRMATRELVIR/RITONAVIR (PAXLOVID)TABLET: 3.0000 | ORAL_TABLET | Freq: Two times a day (BID) | ORAL | 0 refills | Status: AC

## 2023-07-05 NOTE — Progress Notes (Signed)
    Virtual Visit via Video Note  I connected with David Richards. on 07/05/23 at  3:00 PM EST by a video enabled telemedicine application and verified that I am speaking with the correct person using two identifiers.   I discussed the limitations of evaluation and management by telemedicine and the availability of in person appointments. The patient expressed understanding and agreed to proceed.  Patient location: at home Provider location: in office  Subjective:    CC:  No chief complaint on file.   HPI:  Sxs  + sick exposure to COVID in the home . Tested pos for COVID this weekend. Started feeling back Friday night.  Possible fever.   + bodyaches and + chills.  No GI sxs   Past medical history, Surgical history, Family history not pertinant except as noted below, Social history, Allergies, and medications have been entered into the medical record, reviewed, and corrections made.    Objective:    General: Speaking clearly in complete sentences without any shortness of breath.  Alert and oriented x3.  Normal judgment. No apparent acute distress.    Impression and Recommendations:    Problem List Items Addressed This Visit   None Visit Diagnoses       COVID-19    -  Primary   Relevant Medications   nirmatrelvir /ritonavir  (PAXLOVID ) 20 x 150 MG & 10 x 100MG  TABS       Discussed medication as option. She would like to proceed. Call if not better in one week. Ok to continue symptomatic care..  No orders of the defined types were placed in this encounter.   Meds ordered this encounter  Medications   nirmatrelvir /ritonavir  (PAXLOVID ) 20 x 150 MG & 10 x 100MG  TABS    Sig: Take 3 tablets by mouth 2 (two) times daily for 5 days. (Take nirmatrelvir  150 mg two tablets twice daily for 5 days and ritonavir  100 mg one tablet twice daily for 5 days) Patient GFR is 121    Dispense:  30 tablet    Refill:  0     I discussed the assessment and treatment plan with the  patient. The patient was provided an opportunity to ask questions and all were answered. The patient agreed with the plan and demonstrated an understanding of the instructions.   The patient was advised to call back or seek an in-person evaluation if the symptoms worsen or if the condition fails to improve as anticipated.   Duaine German, MD

## 2023-07-05 NOTE — Progress Notes (Signed)
 Pt tested Positive last night. His sxs congestion, cough,achy

## 2023-10-20 ENCOUNTER — Encounter: Payer: Self-pay | Admitting: Family Medicine

## 2023-10-20 ENCOUNTER — Ambulatory Visit (INDEPENDENT_AMBULATORY_CARE_PROVIDER_SITE_OTHER): Admitting: Family Medicine

## 2023-10-20 VITALS — BP 128/70 | HR 50 | Ht 69.0 in | Wt 177.1 lb

## 2023-10-20 DIAGNOSIS — K645 Perianal venous thrombosis: Secondary | ICD-10-CM

## 2023-10-20 MED ORDER — HYDROCORTISONE 2.5 % EX CREA: TOPICAL_CREAM | Freq: Two times a day (BID) | CUTANEOUS | 1 refills | Status: DC

## 2023-10-20 NOTE — Progress Notes (Signed)
   Acute Office Visit  Subjective:     Patient ID: David Richards., male    DOB: Oct 15, 2000, 23 y.o.   MRN: [id number]  Chief Complaint  Patient presents with   Hemorrhoids    HPI Patient is in today for hemorrhoid pain. Had a flare a few weeks ago and then got better. Then started again about 1.5 weeks ago.  He eats really healthy, drinks plenty of water, had a daily BM.  Now painful for BM.   ROS      Objective:    BP 128/70   Pulse (!) 50   Ht 5\' 9"  (1.753 m)   Wt 177 lb 1.9 oz (80.3 kg)   SpO2 100%   BMI 26.16 kg/m    Physical Exam Genitourinary:    Comments: Round shaped raised firm area on the left side of rectum approx 1 cm. Normal rectal tone    No results found for any visits on 10/20/23.      Assessment & Plan:   Problem List Items Addressed This Visit   None Visit Diagnoses       Thrombosed hemorrhoids    -  Primary   Relevant Orders   Ambulatory referral to General Surgery        Hemorrhoid is thrombosed. Discussed lancing it.  He would prefer conservative measures.  Recommend Sitz bath, topical steroid and topical lidocaine. Work on softening stools.  He desires definitive tx.  Will refer to gen surg.     Meds ordered this encounter  Medications   hydrocortisone 2.5 % cream    Sig: Apply topically 2 (two) times daily. To hemorrhoids    Dispense:  30 g    Refill:  1    No follow-ups on file.  Duaine German, MD

## 2023-11-19 ENCOUNTER — Telehealth: Payer: Self-pay

## 2023-11-19 NOTE — Telephone Encounter (Signed)
 Pulled [facility] -placed in your basket for review . Does not look like childhood vaccines are documented there.

## 2023-11-19 NOTE — Telephone Encounter (Signed)
 He has never had the Measles vaccine.  Let her know that we have had her first case in [place]  officially.  Are happy to administer the vaccine if he would like to schedule a nurse visit we can give the first MMR.

## 2023-11-19 NOTE — Telephone Encounter (Signed)
 Copied from CRM [id number]. Topic: Clinical - Medical Advice >> Nov 18, 2023  4:23 PM Merlynn A wrote: Reason for CRM: Patients mother Consuelo called in to confirm if patient has had measels shot before. Please contact her back at [phone].

## 2023-11-22 NOTE — Telephone Encounter (Signed)
 Patient mother informed and will check with patient to see when he would like to scheduled.

## 2024-01-25 ENCOUNTER — Encounter: Payer: Self-pay | Admitting: Sports Medicine

## 2024-02-08 ENCOUNTER — Ambulatory Visit (INDEPENDENT_AMBULATORY_CARE_PROVIDER_SITE_OTHER): Admitting: Family Medicine

## 2024-02-08 ENCOUNTER — Encounter: Payer: Self-pay | Admitting: Family Medicine

## 2024-02-08 VITALS — BP 104/56 | HR 62 | Ht 69.0 in | Wt 164.0 lb

## 2024-02-08 DIAGNOSIS — R220 Localized swelling, mass and lump, head: Secondary | ICD-10-CM

## 2024-02-08 DIAGNOSIS — Z0000 Encounter for general adult medical examination without abnormal findings: Secondary | ICD-10-CM

## 2024-02-08 MED ORDER — FLUTICASONE PROPIONATE 50 MCG/ACT NA SUSP: 1.0000 | Freq: Every day | NASAL | 1 refills | Status: DC

## 2024-02-08 NOTE — Progress Notes (Signed)
 Complete physical exam  Patient: David Richards.   DOB: 2001/04/08   23 y.o. Male  MRN: [id number]  Subjective:    Chief Complaint  Patient presents with   Annual Exam    David Richards. is a 23 y.o. male who presents today for a complete physical exam. He reports consuming a general diet. Weight lifts and walks for exercise.  He generally feels well. He reports sleeping fairly well. He does not have additional problems to discuss today.   He also reports that he is having some chronic congestion in his right nostril.  He does not really have any allergy symptoms such as sneezing or drainage or congestion chin that is productive.  He says occasionally it will can open up and feel little bit better but mostly it chronically stays closed and has been going on for months.  No injury or trauma.  Most recent fall risk assessment:    09/24/2022   10:36 AM  Fall Risk   Falls in the past year? 0  Number falls in past yr: 0  Injury with Fall? 0  Risk for fall due to : No Fall Risks  Follow up Falls evaluation completed     Most recent depression screenings:    02/08/2024   11:16 AM 09/24/2022   10:36 AM  PHQ 2/9 Scores  PHQ - 2 Score 0 0         Patient Care Team: Alvan Dorothyann JONETTA, MD as PCP - General (Family Medicine)   Outpatient Medications Prior to Visit  Medication Sig   EPINEPHRINE  0.3 mg/0.3 mL IJ SOAJ injection INJECT 0.3 MG INTO THE MUSCLE AS NEEDED FOR ANAPHYLAXIS.   [DISCONTINUED] hydrocortisone  2.5 % cream Apply topically 2 (two) times daily. To hemorrhoids   No facility-administered medications prior to visit.    ROS        Objective:     BP (!) 104/56   Pulse 62   Ht 5' 9 (1.753 m)   Wt 164 lb 0.6 oz (74.4 kg)   SpO2 99%   BMI 24.22 kg/m     Physical Exam Constitutional:      Appearance: Normal appearance.  HENT:     Head: Normocephalic and atraumatic.     Right Ear: Tympanic membrane, ear canal and external ear normal.     Left  Ear: Tympanic membrane, ear canal and external ear normal.     Nose: Nose normal.     Mouth/Throat:     Pharynx: Oropharynx is clear.  Eyes:     Extraocular Movements: Extraocular movements intact.     Conjunctiva/sclera: Conjunctivae normal.     Pupils: Pupils are equal, round, and reactive to light.  Neck:     Thyroid: No thyromegaly.  Cardiovascular:     Rate and Rhythm: Normal rate and regular rhythm.  Pulmonary:     Effort: Pulmonary effort is normal.     Breath sounds: Normal breath sounds.  Abdominal:     General: Bowel sounds are normal.     Palpations: Abdomen is soft.     Tenderness: There is no abdominal tenderness.  Musculoskeletal:        General: No swelling.     Cervical back: Neck supple.  Skin:    General: Skin is warm and dry.  Neurological:     Mental Status: He is oriented to person, place, and time.  Psychiatric:        Mood and Affect: Mood normal.  Behavior: Behavior normal.      No results found for any visits on 02/08/24.      Assessment & Plan:    Routine Health Maintenance and Physical Exam  Immunization History  Administered Date(s) Administered   H1N1 04/04/2008   Meningococcal Conjugate 12/28/2012   PFIZER(Purple Top)SARS-COV-2 Vaccination 07/26/2019, 08/16/2019   Tdap 12/23/2011    Health Maintenance  Topic Date Due   Pneumococcal Vaccine (1 of 2 - PCV) Never done   Hepatitis B Vaccines 19-59 Average Risk (1 of 3 - 19+ 3-dose series) Never done   DTaP/Tdap/Td (2 - Td or Tdap) 12/22/2021   Influenza Vaccine  08/22/2024 (Originally 12/24/2023)   COVID-19 Vaccine (3 - Pfizer risk series) 10/09/2024 (Originally 09/13/2019)   HPV VACCINES (1 - Risk male 3-dose series) 09/23/2025 (Originally 07/24/2009)   HIV Screening  09/18/2028 (Originally 07/24/2013)   Hepatitis C Screening  Completed   Meningococcal B Vaccine  Aged Out    Discussed health benefits of physical activity, and encouraged him to engage in regular exercise appropriate  for his age and condition.  Problem List Items Addressed This Visit   None Visit Diagnoses       Well adult exam    -  Primary   Relevant Orders   CMP14+EGFR   Lipid panel   CBC     Swollen nostril       Relevant Medications   fluticasone  (FLONASE ) 50 MCG/ACT nasal spray      He does have some chronic congestion in that right nostril-no allergy symptoms but we did discuss maybe at least doing a trial of fluticasone  since he has not done or tried anything specific if that does not seem to be helping then we can always refer him to ENT it may be more structural.  His nasal bridge technically curves a little bit to the left so I would suspect that the septum would be deviated a little bit more to the left but again internally there may be narrowing etc. which might be contributing to his symptoms.  Declined Tdap and flu today.    Return in about 1 year (around 02/07/2025).     Dorothyann Byars, MD

## 2024-02-08 NOTE — Patient Instructions (Addendum)
 Will refer you to ENT if the congestion is not improving with the spray

## 2024-02-09 ENCOUNTER — Ambulatory Visit: Payer: Self-pay | Admitting: Family Medicine

## 2024-02-09 LAB — CBC
Hematocrit: 42.1 % (ref 37.5–51.0)
Hemoglobin: 13.7 g/dL (ref 13.0–17.7)
MCH: 29.6 pg (ref 26.6–33.0)
MCHC: 32.5 g/dL (ref 31.5–35.7)
MCV: 91 fL (ref 79–97)
Platelets: 294 x10E3/uL (ref 150–450)
RBC: 4.63 x10E6/uL (ref 4.14–5.80)
RDW: 12.4 % (ref 11.6–15.4)
WBC: 6 x10E3/uL (ref 3.4–10.8)

## 2024-02-09 LAB — CMP14+EGFR
ALT: 46 IU/L — ABNORMAL HIGH (ref 0–44)
AST: 32 IU/L (ref 0–40)
Albumin: 4.5 g/dL (ref 4.3–5.2)
Alkaline Phosphatase: 101 IU/L (ref 47–123)
BUN/Creatinine Ratio: 26 — ABNORMAL HIGH (ref 9–20)
BUN: 26 mg/dL — ABNORMAL HIGH (ref 6–20)
Bilirubin Total: 0.5 mg/dL (ref 0.0–1.2)
CO2: 22 mmol/L (ref 20–29)
Calcium: 9.3 mg/dL (ref 8.7–10.2)
Chloride: 106 mmol/L (ref 96–106)
Creatinine, Ser: 0.99 mg/dL (ref 0.76–1.27)
Globulin, Total: 2 g/dL (ref 1.5–4.5)
Glucose: 81 mg/dL (ref 70–99)
Potassium: 4.4 mmol/L (ref 3.5–5.2)
Sodium: 141 mmol/L (ref 134–144)
Total Protein: 6.5 g/dL (ref 6.0–8.5)
eGFR: 108 mL/min/1.73 (ref >59)

## 2024-02-09 LAB — LIPID PANEL
Chol/HDL Ratio: 4.2 ratio (ref 0.0–5.0)
Cholesterol, Total: 175 mg/dL (ref 100–199)
HDL: 42 mg/dL (ref >39)
LDL Chol Calc (NIH): 123 mg/dL — ABNORMAL HIGH (ref 0–99)
Triglycerides: 49 mg/dL (ref 0–149)
VLDL Cholesterol Cal: 10 mg/dL (ref 5–40)

## 2024-02-09 NOTE — Progress Notes (Signed)
 Hi David Richards, kidney function is stable.  You were little bit dry on your blood work so just make sure you are hydrating well.  One of your liver enzymes the ALT is up just slightly they have been up before but it was a couple years ago.  Will just keep an eye on it continue to work on healthy diet and regular exercise like you have been doing your LDL cholesterol looks much better this year!  The LDL is down to 123 and it was 150 so that is fantastic!  Just keep up the good work.  Also your HDL, the good cholesterol, went upwards which is fantastic.  That is directly a result of exercising and working out regularly.  Blood count is normal no sign of anemia.

## 2024-03-02 ENCOUNTER — Other Ambulatory Visit: Payer: Self-pay | Admitting: Family Medicine

## 2024-03-02 DIAGNOSIS — R220 Localized swelling, mass and lump, head: Secondary | ICD-10-CM
